# Patient Record
Sex: Female | Born: 1980 | Race: Black or African American | Hispanic: No | Smoking: Light tobacco smoker
Health system: Southern US, Community
[De-identification: ages and names within clinical notes are randomized; demographics above are authoritative.]

## PROBLEM LIST (undated history)

## (undated) ENCOUNTER — Inpatient Hospital Stay (HOSPITAL_COMMUNITY): Payer: Self-pay

## (undated) DIAGNOSIS — E282 Polycystic ovarian syndrome: Secondary | ICD-10-CM

## (undated) DIAGNOSIS — N926 Irregular menstruation, unspecified: Secondary | ICD-10-CM

## (undated) DIAGNOSIS — O24419 Gestational diabetes mellitus in pregnancy, unspecified control: Secondary | ICD-10-CM

## (undated) DIAGNOSIS — Z202 Contact with and (suspected) exposure to infections with a predominantly sexual mode of transmission: Secondary | ICD-10-CM

## (undated) DIAGNOSIS — N883 Incompetence of cervix uteri: Secondary | ICD-10-CM

## (undated) DIAGNOSIS — R87629 Unspecified abnormal cytological findings in specimens from vagina: Secondary | ICD-10-CM

## (undated) DIAGNOSIS — N760 Acute vaginitis: Secondary | ICD-10-CM

## (undated) DIAGNOSIS — B9689 Other specified bacterial agents as the cause of diseases classified elsewhere: Secondary | ICD-10-CM

## (undated) DIAGNOSIS — N200 Calculus of kidney: Secondary | ICD-10-CM

## (undated) DIAGNOSIS — IMO0001 Reserved for inherently not codable concepts without codable children: Secondary | ICD-10-CM

## (undated) DIAGNOSIS — Z62810 Personal history of physical and sexual abuse in childhood: Secondary | ICD-10-CM

## (undated) DIAGNOSIS — O4290 Premature rupture of membranes, unspecified as to length of time between rupture and onset of labor, unspecified weeks of gestation: Secondary | ICD-10-CM

## (undated) DIAGNOSIS — R51 Headache: Secondary | ICD-10-CM

## (undated) DIAGNOSIS — K219 Gastro-esophageal reflux disease without esophagitis: Secondary | ICD-10-CM

## (undated) DIAGNOSIS — R519 Headache, unspecified: Secondary | ICD-10-CM

## (undated) DIAGNOSIS — F419 Anxiety disorder, unspecified: Secondary | ICD-10-CM

## (undated) HISTORY — DX: Headache, unspecified: R51.9

## (undated) HISTORY — DX: Gastro-esophageal reflux disease without esophagitis: K21.9

## (undated) HISTORY — DX: Contact with and (suspected) exposure to infections with a predominantly sexual mode of transmission: Z20.2

## (undated) HISTORY — PX: DILATION AND CURETTAGE OF UTERUS: SHX78

## (undated) HISTORY — DX: Headache: R51

## (undated) HISTORY — DX: Personal history of physical and sexual abuse in childhood: Z62.810

## (undated) HISTORY — DX: Gestational diabetes mellitus in pregnancy, unspecified control: O24.419

## (undated) HISTORY — DX: Incompetence of cervix uteri: N88.3

## (undated) HISTORY — DX: Anxiety disorder, unspecified: F41.9

## (undated) HISTORY — DX: Calculus of kidney: N20.0

---
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
## 2000-02-26 ENCOUNTER — Inpatient Hospital Stay (HOSPITAL_COMMUNITY): Admission: AD | Admit: 2000-02-26 | Discharge: 2000-02-26 | Payer: Self-pay | Admitting: *Deleted

## 2001-08-22 ENCOUNTER — Emergency Department (HOSPITAL_COMMUNITY): Admission: EM | Admit: 2001-08-22 | Discharge: 2001-08-22 | Payer: Self-pay | Admitting: Emergency Medicine

## 2001-09-16 ENCOUNTER — Emergency Department (HOSPITAL_COMMUNITY): Admission: EM | Admit: 2001-09-16 | Discharge: 2001-09-16 | Payer: Self-pay | Admitting: Emergency Medicine

## 2001-11-24 ENCOUNTER — Emergency Department (HOSPITAL_COMMUNITY): Admission: EM | Admit: 2001-11-24 | Discharge: 2001-11-24 | Payer: Self-pay | Admitting: Emergency Medicine

## 2001-11-30 ENCOUNTER — Emergency Department (HOSPITAL_COMMUNITY): Admission: EM | Admit: 2001-11-30 | Discharge: 2001-11-30 | Payer: Self-pay | Admitting: *Deleted

## 2002-02-03 ENCOUNTER — Inpatient Hospital Stay (HOSPITAL_COMMUNITY): Admission: AD | Admit: 2002-02-03 | Discharge: 2002-02-03 | Payer: Self-pay | Admitting: *Deleted

## 2002-05-15 ENCOUNTER — Inpatient Hospital Stay (HOSPITAL_COMMUNITY): Admission: AD | Admit: 2002-05-15 | Discharge: 2002-05-15 | Payer: Self-pay | Admitting: *Deleted

## 2002-10-05 ENCOUNTER — Inpatient Hospital Stay (HOSPITAL_COMMUNITY): Admission: AD | Admit: 2002-10-05 | Discharge: 2002-10-05 | Payer: Self-pay | Admitting: *Deleted

## 2003-03-16 ENCOUNTER — Inpatient Hospital Stay (HOSPITAL_COMMUNITY): Admission: AD | Admit: 2003-03-16 | Discharge: 2003-03-16 | Payer: Self-pay | Admitting: Obstetrics & Gynecology

## 2003-10-14 ENCOUNTER — Inpatient Hospital Stay (HOSPITAL_COMMUNITY): Admission: AD | Admit: 2003-10-14 | Discharge: 2003-10-14 | Payer: Self-pay | Admitting: Obstetrics and Gynecology

## 2007-05-16 HISTORY — PX: ENDOMETRIAL BIOPSY: SHX622

## 2007-06-17 ENCOUNTER — Emergency Department (HOSPITAL_COMMUNITY): Admission: EM | Admit: 2007-06-17 | Discharge: 2007-06-17 | Payer: Self-pay | Admitting: Family Medicine

## 2007-06-18 ENCOUNTER — Inpatient Hospital Stay (HOSPITAL_COMMUNITY): Admission: AD | Admit: 2007-06-18 | Discharge: 2007-06-18 | Payer: Self-pay | Admitting: Obstetrics and Gynecology

## 2007-07-10 ENCOUNTER — Encounter (INDEPENDENT_AMBULATORY_CARE_PROVIDER_SITE_OTHER): Payer: Self-pay | Admitting: Gynecology

## 2007-07-10 ENCOUNTER — Ambulatory Visit: Payer: Self-pay | Admitting: Gynecology

## 2007-07-10 ENCOUNTER — Other Ambulatory Visit: Admission: RE | Admit: 2007-07-10 | Discharge: 2007-07-10 | Payer: Self-pay | Admitting: Obstetrics and Gynecology

## 2007-07-11 ENCOUNTER — Ambulatory Visit: Payer: Self-pay | Admitting: Family Medicine

## 2007-07-12 ENCOUNTER — Ambulatory Visit (HOSPITAL_COMMUNITY): Admission: RE | Admit: 2007-07-12 | Discharge: 2007-07-12 | Payer: Self-pay | Admitting: Family Medicine

## 2007-07-18 ENCOUNTER — Ambulatory Visit: Payer: Self-pay | Admitting: Internal Medicine

## 2007-08-12 ENCOUNTER — Ambulatory Visit: Payer: Self-pay | Admitting: *Deleted

## 2007-09-04 ENCOUNTER — Inpatient Hospital Stay (HOSPITAL_COMMUNITY): Admission: AD | Admit: 2007-09-04 | Discharge: 2007-09-04 | Payer: Self-pay | Admitting: Obstetrics & Gynecology

## 2007-09-05 ENCOUNTER — Ambulatory Visit: Payer: Self-pay | Admitting: Obstetrics and Gynecology

## 2007-12-16 ENCOUNTER — Ambulatory Visit: Payer: Self-pay | Admitting: Internal Medicine

## 2008-01-19 ENCOUNTER — Emergency Department (HOSPITAL_COMMUNITY): Admission: EM | Admit: 2008-01-19 | Discharge: 2008-01-19 | Payer: Self-pay | Admitting: Emergency Medicine

## 2008-03-24 ENCOUNTER — Ambulatory Visit: Payer: Self-pay | Admitting: Internal Medicine

## 2008-03-25 ENCOUNTER — Encounter (INDEPENDENT_AMBULATORY_CARE_PROVIDER_SITE_OTHER): Payer: Self-pay | Admitting: Internal Medicine

## 2008-03-25 LAB — CONVERTED CEMR LAB
Prolactin: 11 ng/mL
TSH: 1.97 microintl units/mL (ref 0.350–4.50)

## 2008-04-22 ENCOUNTER — Ambulatory Visit: Payer: Self-pay | Admitting: Internal Medicine

## 2008-06-11 ENCOUNTER — Inpatient Hospital Stay (HOSPITAL_COMMUNITY): Admission: AD | Admit: 2008-06-11 | Discharge: 2008-06-11 | Payer: Self-pay | Admitting: Obstetrics and Gynecology

## 2010-08-29 LAB — URINALYSIS, ROUTINE W REFLEX MICROSCOPIC
Bilirubin Urine: NEGATIVE
Glucose, UA: NEGATIVE mg/dL
Ketones, ur: NEGATIVE mg/dL
Leukocytes, UA: NEGATIVE
Nitrite: NEGATIVE
Protein, ur: NEGATIVE mg/dL
Specific Gravity, Urine: 1.025 (ref 1.005–1.030)
Urobilinogen, UA: 0.2 mg/dL (ref 0.0–1.0)
pH: 6 (ref 5.0–8.0)

## 2010-08-29 LAB — WET PREP, GENITAL
Trich, Wet Prep: NONE SEEN
Yeast Wet Prep HPF POC: NONE SEEN

## 2010-08-29 LAB — URINE MICROSCOPIC-ADD ON

## 2010-08-29 LAB — GC/CHLAMYDIA PROBE AMP, GENITAL
Chlamydia, DNA Probe: NEGATIVE
GC Probe Amp, Genital: NEGATIVE

## 2010-08-29 LAB — POCT PREGNANCY, URINE: Preg Test, Ur: NEGATIVE

## 2010-09-27 NOTE — Group Therapy Note (Signed)
NAME:  Stacy [name], Stacy [name] NO.:  [id number]   MEDICAL RECORD NO.:  [id number]          PATIENT TYPE:  WOC   LOCATION:  [facility]                   FACILITY:  [facility]   PHYSICIAN:  Ginger Carne, MD DATE OF BIRTH:  [birth year]   DATE OF SERVICE:  07/10/2007                                  CLINIC NOTE   This patient is a 30 year old nulligravida with a longstanding history  of irregular menses since the onset of her periods beginning at age 81.  She has been intermittently on oral contraceptives which apparently she  takes anywhere from 3-6 months and then stops said medications on her  own.  Apparently she has moved around and has never really stayed on a  particular contraceptive for any period of time.  She states usually  when she is on them, she has either regular periods or slight spotting  but still has a regular menses.  She complains of slight hirsutism under  her chin and has had intermittent pelvic pain over the course of  years.  She does not have dyspareunia but does complain of pain after  intercourse.  She has no GI or GU sources for her pain and she has no  musculoskeletal or neurological sources for said pain.  The patient has  no family history of bleeding diatheses.  Takes no medications to  enhance her bleeding propensity.  She has never been hospitalized for  pelvic pain or been given a diagnosis of pelvic inflammatory disease.  She has never been treated for same either.  Remainder of her medical  history is unimpressive.  She is a half pack a day smoker however.   PHYSICAL EXAM:  VITAL SIGNS:  Per office record.  ABDOMEN:  Soft without gross hepatosplenomegaly.  Minimal tenderness in  the lower abdomen.  PELVIC:  Exam, external genitalia, vulva and vagina normal.  Cervix is  smooth without erosions or lesions.  The uterus is normal in size,  minimal tenderness.  Both adnexa palpable, minimal tenderness without  masses.   An endometrial  biopsy was obtained including a Pap smear with GC and  chlamydia culture.   IMPRESSION AND PLAN:  I think the patient may well have polycystic  ovarian syndrome.  However, she could also suffer from chronic  endometritis an endometrial biopsy will be helpful in delineating this  matter.  Her uterus sounded to 7 cm.  I have also ordered a pelvic  sonogram, 17 hydroxy progesterone, prolactin level, TSH, total  testosterone level and a serum pregnancy test as well.  If all turns out  to be normal, hopefully she will stay on her contraceptive medication  and have her menses straighten out.  I encouraged her to continue on the  medication I have prescribed for her, Ortho-Novum 1/35.  If she  continues to have pain with her contraception, it may be necessary to  proceed with a diagnostic laparoscopy in the future to exclude the  either pelvic inflammatory disease or endometriosis.  The patient will  return in 3 to 4 weeks to discuss her findings.  ______________________________  Ginger Carne, MD     SHB/MEDQ  D:  07/10/2007  T:  07/11/2007  Job:  [id number]

## 2010-09-27 NOTE — Group Therapy Note (Signed)
NAME:  Stacy [name], Stacy [name] NO.:  [id number]   MEDICAL RECORD NO.:  [id number]          PATIENT TYPE:  WOC   LOCATION:  [facility]                   FACILITY:  [facility]   PHYSICIAN:  Argentina Donovan, MD        DATE OF BIRTH:  11-09-1980   DATE OF SERVICE:  09/05/2007                                  CLINIC NOTE   The patient present today with complaints of large mass of tissue  expelled from the vagina.  The patient has a history of polycystic  ovarian syndrome and was placed on Desogen.  The patient was seen at [facility]U  last night.  Serum HCG was completed.  It was less than 2.  The patient  reports that the discharge was not sent to the lab.  Today, the patient  just had spotting, no severe bleeding.  She said that it started with  increased pain, then passed the discharge, then there was heavy  bleeding, then today it did resolved to just spotting.  The patient's  last menstrual period began on September 02, 2007.  She also reports white  discharge prior to menstruation starting which had a positive odor.   SURGICAL HISTORY:  None.   SOCIAL HISTORY:  No drug abuse.  Smokes.  Occasionally drinks.   GYNECOLOGIC HISTORY:  No history of abnormal Pap smears.  No history of  sexually transmitted infections.  One partner x4 years.   PHYSICAL EXAMINATION:  PELVIC:  Uterus normal size, normal, nontender  with palpation.  Some bleeding from the os.   ASSESSMENT:  1. Bacterial vaginosis.  2. Menstruation.   PLAN:  Serum HCG quant per the patient's request.  The patient was given  reassurance that it was more than likely a blood clot that was there  that passed with normal menstruation and informed the patient that this  can occur and it is normal.  The patient was also given a prescription  for metronidazole 500 mg one p.o. b.i.d. x7 days for suspected bacterial  vaginosis.  The patient is to follow up for any other problems or  concerns.      Sid Falcon, CNM    ______________________________  Argentina Donovan, MD    WM/MEDQ  D:  09/05/2007  T:  09/05/2007  Job:  [id number]

## 2011-02-03 LAB — POCT URINALYSIS DIP (DEVICE)
Bilirubin Urine: NEGATIVE
Glucose, UA: NEGATIVE
Ketones, ur: NEGATIVE
Nitrite: NEGATIVE
Operator id: 235561
Protein, ur: NEGATIVE
Specific Gravity, Urine: 1.02
Urobilinogen, UA: 0.2
pH: 5.5

## 2011-02-03 LAB — POCT PREGNANCY, URINE
Operator id: 116391
Operator id: 220991
Preg Test, Ur: NEGATIVE
Preg Test, Ur: NEGATIVE

## 2011-02-03 LAB — CBC
HCT: 37.2
HCT: 35.9 — ABNORMAL LOW
Hemoglobin: 12.3
Hemoglobin: 12.2
MCHC: 33
MCHC: 33.9
MCV: 83
MCV: 82.3
Platelets: 269
Platelets: 256
RBC: 4.48
RBC: 4.36
RDW: 15.9 — ABNORMAL HIGH
RDW: 16 — ABNORMAL HIGH
WBC: 5.5
WBC: 5.5

## 2011-02-07 LAB — POCT PREGNANCY, URINE
Operator id: 202651
Preg Test, Ur: NEGATIVE

## 2011-10-22 ENCOUNTER — Encounter (HOSPITAL_COMMUNITY): Payer: Self-pay | Admitting: Obstetrics and Gynecology

## 2011-10-22 ENCOUNTER — Inpatient Hospital Stay (HOSPITAL_COMMUNITY)
Admission: AD | Admit: 2011-10-22 | Discharge: 2011-10-22 | Disposition: A | Discharge status: Home / Self Care | Payer: Self-pay | Source: Ambulatory Visit | Attending: Obstetrics & Gynecology | Admitting: Obstetrics & Gynecology

## 2011-10-22 DIAGNOSIS — N949 Unspecified condition associated with female genital organs and menstrual cycle: Secondary | ICD-10-CM | POA: Insufficient documentation

## 2011-10-22 DIAGNOSIS — N760 Acute vaginitis: Secondary | ICD-10-CM | POA: Insufficient documentation

## 2011-10-22 DIAGNOSIS — B9689 Other specified bacterial agents as the cause of diseases classified elsewhere: Secondary | ICD-10-CM | POA: Insufficient documentation

## 2011-10-22 DIAGNOSIS — A499 Bacterial infection, unspecified: Secondary | ICD-10-CM

## 2011-10-22 HISTORY — DX: Irregular menstruation, unspecified: N92.6

## 2011-10-22 HISTORY — DX: Polycystic ovarian syndrome: E28.2

## 2011-10-22 LAB — WET PREP, GENITAL
Trich, Wet Prep: NONE SEEN
Yeast Wet Prep HPF POC: NONE SEEN

## 2011-10-22 LAB — URINALYSIS, ROUTINE W REFLEX MICROSCOPIC
Bilirubin Urine: NEGATIVE
Glucose, UA: NEGATIVE mg/dL
Ketones, ur: NEGATIVE mg/dL
Leukocytes, UA: NEGATIVE
Nitrite: NEGATIVE
Protein, ur: NEGATIVE mg/dL
Specific Gravity, Urine: 1.015 (ref 1.005–1.030)
Urobilinogen, UA: 0.2 mg/dL (ref 0.0–1.0)
pH: 7 (ref 5.0–8.0)

## 2011-10-22 LAB — URINE MICROSCOPIC-ADD ON

## 2011-10-22 LAB — POCT PREGNANCY, URINE: Preg Test, Ur: NEGATIVE

## 2011-10-22 MED ORDER — METRONIDAZOLE 0.75 % VA GEL: 1.0000 | Freq: Two times a day (BID) | VAGINAL | Status: AC

## 2011-10-22 MED ORDER — METRONIDAZOLE 500 MG PO TABS: 500.0000 mg | ORAL_TABLET | Freq: Two times a day (BID) | ORAL | Status: AC

## 2011-10-22 MED ORDER — DOXYCYCLINE HYCLATE 100 MG PO CAPS: 100.0000 mg | ORAL_CAPSULE | Freq: Two times a day (BID) | ORAL | Status: AC

## 2011-10-22 NOTE — [facility] Provider Note (Signed)
History     CSN: [id number]  Arrival date & time 10/22/11  1438   None     Chief Complaint  Patient presents with  . Vaginal Discharge    Recurrent BV x 18 months per pt    (Consider location/radiation/quality/duration/timing/severity/associated sxs/prior treatment) HPI Stacy [name] is a 31 y.o. G0P0 who c/o vaginal odor like when has BV. Pt has had BV chronically since 1/13, has used Flagyl, boric acid supp and probiotics. Most recent tx was Flagyl 2 po after IC, still gets symptoms after. Same partner x 8 yr. Also c/o pulling sensation in low LM, has had off/on for a few years, getting progressively worse and more frequent. Recently moved back to [place], starts a new job tomorrow, no provider yet.   Past Medical History  Diagnosis Date  . PCOS (polycystic ovarian syndrome)   . Irregular menses     Past Surgical History  Procedure Date  . Endometrial biopsy 2009    Family History  Problem Relation Age of Onset  . Mental illness Maternal Aunt   . Diabetes Maternal Uncle     History  Substance Use Topics  . Smoking status: Current Everyday Smoker -- 0.5 packs/day for 15 years    Types: Cigarettes  . Smokeless tobacco: Not on file  . Alcohol Use: Yes     social drinker    OB History    Grav Para Term Preterm Abortions TAB SAB Ect Mult Living   0               Review of Systems  Constitutional: Negative for fever and chills.  Gastrointestinal: Negative.   Genitourinary: Positive for vaginal discharge and pelvic pain. Negative for dysuria, urgency, frequency and vaginal bleeding.    Allergies  Review of patient's allergies indicates no known allergies.  Home Medications  No current outpatient prescriptions on file.  BP 132/91  Pulse 70  Temp(Src) 99.2 F (37.3 C) (Oral)  Resp 16  Ht 5' 4.33" (1.634 m)  Wt 153 lb 3.2 oz (69.491 kg)  BMI 26.03 kg/m2  LMP 10/08/2011  Physical Exam  Constitutional: She is oriented to person, place, and time. She  appears well-developed and well-nourished.  Abdominal: Soft. There is tenderness.       Low ML  Genitourinary:       Pelvic: V/V nl anatomy, skin intact Cx- nullip Uterus- A-V, nl size , sl tender Adn- no masses palp, non tender  Musculoskeletal: Normal range of motion.  Neurological: She is alert and oriented to person, place, and time.  Skin: Skin is warm and dry.    ED Course  Procedures (including critical care time)  Labs Reviewed  URINALYSIS, ROUTINE W REFLEX MICROSCOPIC - Abnormal; Notable for the following:    Hgb urine dipstick MODERATE (*)    All other components within normal limits  URINE MICROSCOPIC-ADD ON - Abnormal; Notable for the following:    Squamous Epithelial / LPF FEW (*)    All other components within normal limits  POCT PREGNANCY, URINE   No results found.   No diagnosis found.    MDM  Persistent Bacterial vaginosis  uterine tenderness Rx for Metrogel  Rx for Flagyl if can't afford Metrogel  Doxycycline 100 mg BID x 7 Pt to get established with provider

## 2011-10-22 NOTE — MAU Note (Signed)
"  I have dealt with this vaginal discharge for the past 18 months.  I notice that the smell is worse after intercourse.  I have been with the same partner for 8 years, so I know it isn't from messing around with different people.  I have been having this pulling sensation in my uterus for the past 6 months.  The pulling sensation comes without warning and is scary to me.  I have been treated numerous times for the BV, but the last time my symptoms have not gone away since January 2013."

## 2011-10-22 NOTE — Discharge Instructions (Signed)
Bacterial Vaginosis Bacterial vaginosis (BV) is a vaginal infection where the normal balance of bacteria in the vagina is disrupted. The normal balance is then replaced by an overgrowth of certain bacteria. There are several different kinds of bacteria that can cause BV. BV is the most common vaginal infection in women of childbearing age. CAUSES   The cause of BV is not fully understood. BV develops when there is an increase or imbalance of harmful bacteria.   Some activities or behaviors can upset the normal balance of bacteria in the vagina and put women at increased risk including:   Having a new sex partner or multiple sex partners.   Douching.   Using an intrauterine device (IUD) for contraception.   It is not clear what role sexual activity plays in the development of BV. However, women that have never had sexual intercourse are rarely infected with BV.  Women do not get BV from toilet seats, bedding, swimming pools or from touching objects around them.  SYMPTOMS   Grey vaginal discharge.   A fish-like odor with discharge, especially after sexual intercourse.   Itching or burning of the vagina and vulva.   Burning or pain with urination.   Some women have no signs or symptoms at all.  DIAGNOSIS  Your caregiver must examine the vagina for signs of BV. Your caregiver will perform lab tests and look at the sample of vaginal fluid through a microscope. They will look for bacteria and abnormal cells (clue cells), a pH test higher than 4.5, and a positive amine test all associated with BV.  RISKS AND COMPLICATIONS   Pelvic inflammatory disease (PID).   Infections following gynecology surgery.   Developing HIV.   Developing herpes virus.  TREATMENT  Sometimes BV will clear up without treatment. However, all women with symptoms of BV should be treated to avoid complications, especially if gynecology surgery is planned. Female partners generally do not need to be treated. However,  BV may spread between female sex partners so treatment is helpful in preventing a recurrence of BV.   BV may be treated with antibiotics. The antibiotics come in either pill or vaginal cream forms. Either can be used with nonpregnant or pregnant women, but the recommended dosages differ. These antibiotics are not harmful to the baby.   BV can recur after treatment. If this happens, a second round of antibiotics will often be prescribed.   Treatment is important for pregnant women. If not treated, BV can cause a premature delivery, especially for a pregnant woman who had a premature birth in the past. All pregnant women who have symptoms of BV should be checked and treated.   For chronic reoccurrence of BV, treatment with a type of prescribed gel vaginally twice a week is helpful.  HOME CARE INSTRUCTIONS   Finish all medication as directed by your caregiver.   Do not have sex until treatment is completed.   Tell your sexual partner that you have a vaginal infection. They should see their caregiver and be treated if they have problems, such as a mild rash or itching.   Practice safe sex. Use condoms. Only have 1 sex partner.  PREVENTION  Basic prevention steps can help reduce the risk of upsetting the natural balance of bacteria in the vagina and developing BV:  Do not have sexual intercourse (be abstinent).   Do not douche.   Use all of the medicine prescribed for treatment of BV, even if the signs and symptoms go away.     Tell your sex partner if you have BV. That way, they can be treated, if needed, to prevent reoccurrence.  SEEK MEDICAL CARE IF:   Your symptoms are not improving after 3 days of treatment.   You have increased discharge, pain, or fever.  MAKE SURE YOU:   Understand these instructions.   Will watch your condition.   Will get help right away if you are not doing well or get worse.  FOR MORE INFORMATION  Division of STD Prevention (DSTDP), Centers for Disease  Control and Prevention: www.cdc.gov/std American Social Health Association (ASHA): www.ashastd.org  Document Released: 05/01/2005 Document Revised: 04/20/2011 Document Reviewed: 10/22/2008 ExitCare Patient Information 2012 [employer]. 

## 2012-11-01 ENCOUNTER — Ambulatory Visit: Payer: Self-pay

## 2012-11-26 LAB — OB RESULTS CONSOLE RUBELLA ANTIBODY, IGM: Rubella: IMMUNE

## 2012-11-26 LAB — OB RESULTS CONSOLE ANTIBODY SCREEN: Antibody Screen: NEGATIVE

## 2012-11-26 LAB — OB RESULTS CONSOLE ABO/RH: RH Type: POSITIVE

## 2012-11-26 LAB — OB RESULTS CONSOLE HEPATITIS B SURFACE ANTIGEN: Hepatitis B Surface Ag: NEGATIVE

## 2012-11-26 LAB — OB RESULTS CONSOLE HIV ANTIBODY (ROUTINE TESTING): HIV: NONREACTIVE

## 2013-01-08 ENCOUNTER — Inpatient Hospital Stay (HOSPITAL_COMMUNITY)
Admission: AD | Admit: 2013-01-08 | Discharge: 2013-01-08 | Disposition: A | Discharge status: Home / Self Care | Payer: Medicaid Other | Source: Ambulatory Visit | Attending: Obstetrics and Gynecology | Admitting: Obstetrics and Gynecology

## 2013-01-08 ENCOUNTER — Encounter (HOSPITAL_COMMUNITY): Payer: Self-pay | Admitting: *Deleted

## 2013-01-08 ENCOUNTER — Inpatient Hospital Stay (HOSPITAL_COMMUNITY): Payer: Medicaid Other

## 2013-01-08 DIAGNOSIS — O99891 Other specified diseases and conditions complicating pregnancy: Secondary | ICD-10-CM | POA: Insufficient documentation

## 2013-01-08 DIAGNOSIS — IMO0001 Reserved for inherently not codable concepts without codable children: Secondary | ICD-10-CM | POA: Diagnosis present

## 2013-01-08 DIAGNOSIS — R109 Unspecified abdominal pain: Secondary | ICD-10-CM | POA: Insufficient documentation

## 2013-01-08 DIAGNOSIS — Z8742 Personal history of other diseases of the female genital tract: Secondary | ICD-10-CM

## 2013-01-08 DIAGNOSIS — M2550 Pain in unspecified joint: Secondary | ICD-10-CM | POA: Insufficient documentation

## 2013-01-08 DIAGNOSIS — N926 Irregular menstruation, unspecified: Secondary | ICD-10-CM

## 2013-01-08 DIAGNOSIS — O30009 Twin pregnancy, unspecified number of placenta and unspecified number of amniotic sacs, unspecified trimester: Secondary | ICD-10-CM | POA: Insufficient documentation

## 2013-01-08 HISTORY — DX: Reserved for inherently not codable concepts without codable children: IMO0001

## 2013-01-08 HISTORY — DX: Other specified bacterial agents as the cause of diseases classified elsewhere: B96.89

## 2013-01-08 HISTORY — DX: Other specified bacterial agents as the cause of diseases classified elsewhere: N76.0

## 2013-01-08 LAB — URINE MICROSCOPIC-ADD ON

## 2013-01-08 LAB — URINALYSIS, ROUTINE W REFLEX MICROSCOPIC
Bilirubin Urine: NEGATIVE
Glucose, UA: NEGATIVE mg/dL
Specific Gravity, Urine: 1.02 (ref 1.005–1.030)
Urobilinogen, UA: 0.2 mg/dL (ref 0.0–1.0)

## 2013-01-08 MED ORDER — IBUPROFEN 600 MG PO TABS: 600.0000 mg | ORAL_TABLET | Freq: Four times a day (QID) | ORAL | Status: DC

## 2013-01-08 MED ORDER — IBUPROFEN 600 MG PO TABS
600.0000 mg | ORAL_TABLET | Freq: Four times a day (QID) | ORAL | Status: DC
  Administered 2013-01-08: 600 mg via ORAL
  Filled 2013-01-08: qty 1

## 2013-01-08 NOTE — MAU Provider Note (Signed)
History   32 yo G2P0010 at 14 weeks with twins presented after calling the office c/o cramping, pain in joints on right side and sporadic numbness around the joints.  "Just was worried with all the various things happening"--concerned regarding well-being of twins.  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  Pregnancy was unexpected, as was twins--has been very stressed with this pregnancy.  Chief Complaint  Patient presents with  . Joint Pain  . Abdominal Pain     OB History   Grav Para Term Preterm Abortions TAB SAB Ect Mult Living   2 0   1  1   0      Past Medical History  Diagnosis Date  . PCOS (polycystic ovarian syndrome)   . Irregular menses   . PCOS (polycystic ovarian syndrome)   . BV (bacterial vaginosis)     Past Surgical History  Procedure Laterality Date  . Endometrial biopsy  2009    Family History  Problem Relation Age of Onset  . Mental illness Maternal Aunt   . Diabetes Maternal Uncle     History  Substance Use Topics  . Smoking status: Light Tobacco Smoker -- 0.50 packs/day for 15 years    Types: Cigarettes  . Smokeless tobacco: Not on file  . Alcohol Use: Yes     Comment: social drinker not while pregnant    Allergies: No Known Allergies  Prescriptions prior to admission  Medication Sig Dispense Refill  . metroNIDAZOLE (FLAGYL) 500 MG tablet Take 500 mg by mouth 3 (three) times daily.      . Prenatal Vit-Fe Fumarate-FA (PRENATAL MULTIVITAMIN) TABS tablet Take 1 tablet by mouth daily at 12 noon.         Physical Exam   Chest clear Heart RRR without murmur Abd gravid, NT Uterus 17 week size, NT Pelvic--cervix closed, NT Extremities without swelling, full ROM noted x 4 extremities.  Sensation intact to pinprick.  FHRs 140s x 2   ED Course  Twin IUP  Mild cramping  Mild joint pain Stressors with pregnancy  Plan: UA ANA Limited US for cervical length Ibuprophen 600 mg po now--plan q 6 hours x 24 hours, then prn. Reassurance and support  regarding status of pregnancy.   Nigel Bridgeman CNM, MN 01/08/2013 2:44 PM  Addendum:  Feeling "about the same" after Ibuprophen.  No acute pain at present.  US WNL, with normal cervical length. UA:  SG 1.020, 15 ketones, moderate blood.  Sent to culture.  D/C'd home. Continue Ibuprophen 600 mg po q 6 hours x 24 hours, then prn. Push po fluids, increase rest as able. OOW tomorrow to allow for rest. Reassured patient regarding stable status of pregnancy.  Support to her regarding work issues. F/U as scheduled or prn.  Nigel Bridgeman, CNM 01/08/13 3p

## 2013-01-08 NOTE — MAU Note (Addendum)
Pt reports having transient joint pain/numbness on her right side (knee, hip, shoulder and elbow.) that started 1 week ago. Pt also c/o of abd/pelvic pain that started today. Pt pregnant with twins.

## 2013-01-10 LAB — CULTURE, OB URINE: Colony Count: 50000

## 2013-03-04 ENCOUNTER — Inpatient Hospital Stay (HOSPITAL_COMMUNITY)
Admission: AD | Admit: 2013-03-04 | Discharge: 2013-03-19 | DRG: 765 | Disposition: A | Discharge status: Home / Self Care | Payer: Medicaid Other | Source: Ambulatory Visit | Attending: Obstetrics and Gynecology | Admitting: Obstetrics and Gynecology

## 2013-03-04 ENCOUNTER — Encounter (HOSPITAL_COMMUNITY): Payer: Self-pay | Admitting: *Deleted

## 2013-03-04 ENCOUNTER — Ambulatory Visit (HOSPITAL_COMMUNITY): Payer: Medicaid Other

## 2013-03-04 ENCOUNTER — Inpatient Hospital Stay (HOSPITAL_COMMUNITY): Payer: Medicaid Other

## 2013-03-04 DIAGNOSIS — O3090 Multiple gestation, unspecified, unspecified trimester: Secondary | ICD-10-CM | POA: Diagnosis present

## 2013-03-04 DIAGNOSIS — O9902 Anemia complicating childbirth: Secondary | ICD-10-CM | POA: Diagnosis present

## 2013-03-04 DIAGNOSIS — O2390 Unspecified genitourinary tract infection in pregnancy, unspecified trimester: Secondary | ICD-10-CM | POA: Diagnosis present

## 2013-03-04 DIAGNOSIS — D649 Anemia, unspecified: Secondary | ICD-10-CM | POA: Diagnosis present

## 2013-03-04 DIAGNOSIS — O3432 Maternal care for cervical incompetence, second trimester: Secondary | ICD-10-CM | POA: Diagnosis not present

## 2013-03-04 DIAGNOSIS — O4290 Premature rupture of membranes, unspecified as to length of time between rupture and onset of labor, unspecified weeks of gestation: Secondary | ICD-10-CM | POA: Diagnosis not present

## 2013-03-04 DIAGNOSIS — B9689 Other specified bacterial agents as the cause of diseases classified elsewhere: Secondary | ICD-10-CM | POA: Diagnosis present

## 2013-03-04 DIAGNOSIS — O30009 Twin pregnancy, unspecified number of placenta and unspecified number of amniotic sacs, unspecified trimester: Secondary | ICD-10-CM | POA: Diagnosis present

## 2013-03-04 DIAGNOSIS — O3430 Maternal care for cervical incompetence, unspecified trimester: Principal | ICD-10-CM | POA: Diagnosis present

## 2013-03-04 DIAGNOSIS — O26879 Cervical shortening, unspecified trimester: Secondary | ICD-10-CM | POA: Diagnosis present

## 2013-03-04 DIAGNOSIS — O99334 Smoking (tobacco) complicating childbirth: Secondary | ICD-10-CM | POA: Diagnosis present

## 2013-03-04 DIAGNOSIS — IMO0001 Reserved for inherently not codable concepts without codable children: Secondary | ICD-10-CM

## 2013-03-04 DIAGNOSIS — O30049 Twin pregnancy, dichorionic/diamniotic, unspecified trimester: Secondary | ICD-10-CM

## 2013-03-04 DIAGNOSIS — N760 Acute vaginitis: Secondary | ICD-10-CM | POA: Diagnosis present

## 2013-03-04 DIAGNOSIS — A499 Bacterial infection, unspecified: Secondary | ICD-10-CM | POA: Diagnosis present

## 2013-03-04 DIAGNOSIS — E282 Polycystic ovarian syndrome: Secondary | ICD-10-CM | POA: Diagnosis present

## 2013-03-04 DIAGNOSIS — O3431 Maternal care for cervical incompetence, first trimester: Secondary | ICD-10-CM

## 2013-03-04 DIAGNOSIS — O411090 Infection of amniotic sac and membranes, unspecified, unspecified trimester, not applicable or unspecified: Secondary | ICD-10-CM | POA: Diagnosis present

## 2013-03-04 DIAGNOSIS — O34599 Maternal care for other abnormalities of gravid uterus, unspecified trimester: Secondary | ICD-10-CM | POA: Diagnosis present

## 2013-03-04 LAB — CBC
HCT: 28.9 % — ABNORMAL LOW (ref 36.0–46.0)
MCHC: 34.9 g/dL (ref 30.0–36.0)
Platelets: 193 10*3/uL (ref 150–400)
RDW: 14.7 % (ref 11.5–15.5)
WBC: 8.3 10*3/uL (ref 4.0–10.5)

## 2013-03-04 LAB — CREATININE, SERUM
Creatinine, Ser: 0.41 mg/dL — ABNORMAL LOW (ref 0.50–1.10)
GFR calc Af Amer: >90 mL/min (ref >90)
GFR calc non Af Amer: >90 mL/min (ref >90)

## 2013-03-04 MED ORDER — ENOXAPARIN SODIUM 40 MG/0.4ML ~~LOC~~ SOLN
40.0000 mg | SUBCUTANEOUS | Status: DC
  Filled 2013-03-04 (×13): qty 0.4

## 2013-03-04 MED ORDER — ZOLPIDEM TARTRATE 5 MG PO TABS
5.0000 mg | ORAL_TABLET | Freq: Every evening | ORAL | Status: DC | PRN
  Administered 2013-03-05 – 2013-03-14 (×9): 5 mg via ORAL
  Filled 2013-03-04 (×10): qty 1

## 2013-03-04 MED ORDER — INDOMETHACIN 25 MG PO CAPS
25.0000 mg | ORAL_CAPSULE | Freq: Four times a day (QID) | ORAL | Status: DC
  Administered 2013-03-05 – 2013-03-11 (×27): 25 mg via ORAL
  Filled 2013-03-04 (×31): qty 1

## 2013-03-04 MED ORDER — CALCIUM CARBONATE ANTACID 500 MG PO CHEW
2.0000 | CHEWABLE_TABLET | ORAL | Status: DC | PRN
  Administered 2013-03-06 (×2): 400 mg via ORAL
  Filled 2013-03-04 (×3): qty 1

## 2013-03-04 MED ORDER — INDOMETHACIN 25 MG PO CAPS
50.0000 mg | ORAL_CAPSULE | Freq: Once | ORAL | Status: AC
  Administered 2013-03-04: 50 mg via ORAL
  Filled 2013-03-04: qty 1

## 2013-03-04 MED ORDER — PROGESTERONE MICRONIZED 200 MG PO CAPS
200.0000 mg | ORAL_CAPSULE | Freq: Every day | ORAL | Status: DC
  Administered 2013-03-04 – 2013-03-10 (×8): 200 mg via VAGINAL
  Filled 2013-03-04 (×9): qty 1

## 2013-03-04 MED ORDER — DOCUSATE SODIUM 100 MG PO CAPS
100.0000 mg | ORAL_CAPSULE | Freq: Every day | ORAL | Status: DC
  Administered 2013-03-05 – 2013-03-15 (×11): 100 mg via ORAL
  Filled 2013-03-04 (×10): qty 1

## 2013-03-04 MED ORDER — PRENATAL MULTIVITAMIN CH
1.0000 | ORAL_TABLET | Freq: Every day | ORAL | Status: DC
  Administered 2013-03-05 – 2013-03-15 (×11): 1 via ORAL
  Filled 2013-03-04 (×11): qty 1

## 2013-03-04 MED ORDER — ACETAMINOPHEN 325 MG PO TABS
650.0000 mg | ORAL_TABLET | ORAL | Status: DC | PRN
  Administered 2013-03-12 – 2013-03-16 (×2): 650 mg via ORAL
  Filled 2013-03-04 (×2): qty 2

## 2013-03-04 MED ORDER — AMOXICILLIN 500 MG PO CAPS
500.0000 mg | ORAL_CAPSULE | Freq: Three times a day (TID) | ORAL | Status: AC
  Administered 2013-03-04 – 2013-03-07 (×9): 500 mg via ORAL
  Filled 2013-03-04 (×9): qty 1

## 2013-03-04 NOTE — H&P (Signed)
Admission History and Physical Exam for an Obstetrics Patient  Ms. Kemonie L Feldmeier is a 32 y.o. female, G2P0010, at [redacted]w[redacted]d gestation, who presents for observation and evaluation of cervical insufficiency. The patient has a diamniotic and dichorionic twin gestation. She was seen one week ago an ultrasound showed that the cervix had thinned to 1.6 cm. She was started on vaginal progesterone. An ultrasound was performed today that showed no measurable cervix. The cervix was slightly dilated. The patient denies rupture membranes and vaginal bleeding. She has had some vague abdominal pain. Gonorrhea and Chlamydia cultures were negative are normal the exam. Urine culture was negative.. She has been followed at the [facility].  Her pregnancy has been complicated by cigarette smoking, bacterial vaginosis, and a history of polycystic ovary disease. See history below.  OB History   Grav Para Term Preterm Abortions TAB SAB Ect Mult Living   2 0   1  1   0      Past Medical History  Diagnosis Date  . PCOS (polycystic ovarian syndrome)   . Irregular menses   . PCOS (polycystic ovarian syndrome)   . BV (bacterial vaginosis)     Prescriptions prior to admission  Medication Sig Dispense Refill  . ibuprofen (ADVIL,MOTRIN) 600 MG tablet Take 1 tablet (600 mg total) by mouth every 6 (six) hours.  30 tablet  0  . Prenatal Vit-Fe Fumarate-FA (PRENATAL MULTIVITAMIN) TABS tablet Take 1 tablet by mouth daily at 12 noon.      . progesterone (PROMETRIUM) 100 MG capsule Take 100 mg by mouth daily.        Past Surgical History  Procedure Laterality Date  . Endometrial biopsy  2009  . Dilation and curettage of uterus      No Known Allergies  Family History: family history includes Diabetes in her maternal uncle; Mental illness in her maternal aunt.  Social History:  reports that she has been smoking Cigarettes.  She has a 7.5 pack-year  smoking history. She does not have any smokeless tobacco history on file. She reports that she drinks alcohol. She reports that she uses illicit drugs (Marijuana).  Review of systems: Normal pregnancy complaints.  Admission Physical Exam:    Body mass index is 27.45 kg/(m^2).  Blood pressure 109/62, pulse 83, temperature 98.3 F (36.8 C), temperature source Oral, resp. rate 20, height 5\' 4"  (1.626 m), weight 160 lb (72.576 kg).  HEENT:                 Within normal limits Chest:                   Clear Heart:                    Regular rate and rhythm Abdomen:             Gravid and nontender Extremities:          Grossly normal Neurologic exam: Grossly normal Pelvic exam:         Cervix: Slightly open and completely effaced by ultrasound report.  Prenatal labs: ABO, Rh:             B/Positive/-- (07/15 0000) HBsAg:                 Negative (07/15 0000)  HIV:  Non-reactive (07/15 0000)  GBS:                      Pending Antibody:              Negative (07/15 0000) Rubella:                  immune  RPR:                    Nonreactive (07/15 0000)      Assessment:  [redacted]w[redacted]d gestation  Diamniotic and dichorionic twin gestation;  Vertex/ breech presentation  Cervical insufficiency  Cigarette smoker  Nonviable gestation  Anemia  Polycystic ovary syndrome  Bacterial vaginosis  Plan:  Please see recommendations a maternal fetal medicine specialist. A cerclage in the current clinical situation is not recommended. A pessary will be investigated.  Prometrium 200 mg vaginally each day.  Urine culture and beta strep cultures are pending.  Amoxicillin 500 mg 3 times each day until the beta strep culture is negative. I do not know that we can justify continuing antibiotics beyond that point.  I will begin indomethacin. There may be a small benefit. Given that her infants are nonviable at this time, I believe the potential benefit does outweigh the  potential risks. The patient will need to have weekly ultrasounds to assess amniotic fluid volumes.  I've requested a neonatology consult.  The patient understands that her infants are nonviable at this point. We will not intervene for fetal indications. She understands that at [redacted] weeks gestation a decision needs to be made whether not to intervene if there is evidence of fetal intolerance of pregnancy. She understands that there is a small chance of survival at that point. At [redacted] weeks gestation she understands that we do recommend intervention for fetal indications.   We will begin DVT prophylaxis.   Janine Limbo 03/04/2013, 6:33 PM

## 2013-03-04 NOTE — Consult Note (Signed)
Maternal Fetal Medicine Consultation  Requesting Provider(s): Kirkland Hun, MD  Reason for consultation: Twin gestation at 21 6/7 weeks, incompetent cervix  HPI: Stacy [name] is a 32 yo G3P0020  currently at 47 6/[redacted] weeks gestation with twin gestation - was seen earlier in clinic today and noted to have a cervical length < 1 cm with fetal membranes visible at the external os.  She was noted to have some cervical shortening last week and was started on vaginal progesterone.  She reports that over the last 2 weeks, she has noted some "round ligament pain", but denies uterine cramping, contractions, vaginal bleeding, heavy discharge or leakage of fluid.  The patient denies any known risk factors for incompetent cervix.  Her prenatal course has otherwise been uncomplicated.  OB History: OB History   Grav Para Term Preterm Abortions TAB SAB Ect Mult Living   2 0   1  1   0      PMH:  Past Medical History  Diagnosis Date  . PCOS (polycystic ovarian syndrome)   . Irregular menses   . PCOS (polycystic ovarian syndrome)   . BV (bacterial vaginosis)     PSH:  Past Surgical History  Procedure Laterality Date  . Endometrial biopsy  2009  . Dilation and curettage of uterus     Meds: PNV, Prometrium  Allergies: No Known Allergies  FH: denies family history of birth defects or hereditary disorders  Soc: smokes 5-6 cigarettes/ day, denies ETOH use during pregnancy  Review of Systems: no vaginal bleeding or cramping/contractions, no LOF, no nausea/vomiting. All other systems reviewed and are negative.  PE:   Filed Vitals:   03/04/13 1402  BP: 109/62  Pulse: 83  Temp: 98.3 F (36.8 C)  Resp: 20    GEN: well-appearing female ABD: gravid, NT  Ultrasound: DC/DA twin gestation with best dates of 21 6/7 weeks Limited ultrasound performed for cervical length  TVUS - cervix is open with V-shaped funneling almost to the external os (~ 2-3 mm cervical length).  Some cervical  debris is noted.  Labs: CBC    Component Value Date/Time   WBC 5.5 06/18/2007 1005   RBC 4.36 06/18/2007 1005   HGB 12.2 06/18/2007 1005   HCT 35.9* 06/18/2007 1005   PLT 256 06/18/2007 1005   MCV 82.3 06/18/2007 1005   MCHC 33.9 06/18/2007 1005   RDW 16.0* 06/18/2007 1005     A/P: 1) DC/DA twins at 21 6/7 weeks         2) Suspected cervical insufficiency - ultrasound findings were reviewed with the patient.  We discussed possible management strategies.  Based on the most recent ACOG Practice Bulletin on cervical insufficiency, ultrasound indicated cerclage is not recommended in Twin gestation and some literature would suggest that cerclage in this setting may actually be associated with worse outcomes.  Concur with vaginal progesterone - although there may not be any significant benefit in multiple gestations, it is a low risk intervention.  We also discussed the possible use of cervical pessary.  Although some preliminary data did not show benefit in multiple gestations, further trials are needed to determine whether on not this may be of some benefit. In my opinion, it is also a very low risk intervention that may have some benefit.  I reviewed this option with the patient and she expressed some interest.  I will need to find out if we have any cervical pessaries available tomorrow.  If available, will evaluate the patient  further and possibly place a pessary tomorrow evening.   Thank you for the opportunity to be a part of the care of Stacy [name]. Please contact our office if we can be of further assistance.   I spent approximately 30 minutes with this patient with over 50% of time spent in face-to-face counseling.  Alpha Gula, MD Maternal Fetal Medicine

## 2013-03-05 ENCOUNTER — Encounter (HOSPITAL_COMMUNITY): Payer: Self-pay | Admitting: *Deleted

## 2013-03-05 LAB — URINE CULTURE: Colony Count: 40000

## 2013-03-05 LAB — TYPE AND SCREEN
ABO/RH(D): B POS
Antibody Screen: NEGATIVE

## 2013-03-05 LAB — AMNISURE RUPTURE OF MEMBRANE (ROM) NOT AT ARMC: Amnisure ROM: NEGATIVE

## 2013-03-05 NOTE — Progress Notes (Signed)
Patient called out to nurse's station asking to see her nurse.  RN comes to bedside and patient states, "I feel like I'm leaking, my vagina is wet."  RN inspected vaginal area, no fluid noted on bed pad or perineum.  Fern test was negative, J Oxley CNM notified of patient's concern and fern result.  CNM ordered Asa Lente collected by [employer].  Amnisure result was negative and J Oxley CNM was notified of result.

## 2013-03-05 NOTE — Progress Notes (Signed)
  Subjective: Pt called RN to BS to report increased vaginal d/c worried she ruptured.   Objective: BP 112/45  Pulse 76  Temp(Src) 98.3 F (36.8 C) (Oral)  Resp 18  Ht 5\' 4"  (1.626 m)  Wt 160 lb (72.576 kg)  BMI 27.45 kg/m2     Fern inconclusive Amnisure neg   FHT:  Last doppler 135 UC:   None SVE:    Deferred  Assessment / Plan:  Labor: No labor at this time; no ROM at this time Preeclampsia: no s/s Fetal Wellbeing: Appropriate for GA Pain Control: None needed at this time I/D: GBS unknown; Amoxicillin 500mg  TID until GBS is neg    Erricka Falkner 03/05/2013, 6:53 AM

## 2013-03-05 NOTE — Progress Notes (Signed)
03/05/13 1400  Clinical Encounter Type  Visited With Patient  Visit Type Initial;Spiritual support;Social support  Referral From Nurse  Recommendations Spiritual Care will follow for support.  Spiritual Encounters  Spiritual Needs Emotional;Prayer  Stress Factors  Patient Stress Factors Family relationships;Financial concerns;Lack of caregivers;Loss of control;Major life changes   Stacy [name] was very receptive to pastoral presence and welcomed opportunity to share and process her story.  She reports very little support.  Both of her parents are deceased, and her boyfriend lives in [place].  While she appears to be a strong supporter of her siblings and friends, she reports that those relationships are less reciprocal than she would like.  Per pt, her coworkers and supervisors are very supportive, but they all live in [place] area.  Bylonappears to be both realistic and hopeful about the current status of her pregnancy.  We prayed for her and for her babies by name.    Provided introduction to Spiritual Care and chaplain availability, reflective listening, encouragement and affirmation, and discussion of coping tools.  Will follow for emotional and spiritual support.  [address] [place], [place] [phone]

## 2013-03-05 NOTE — Progress Notes (Signed)
Came by to see patient--doing well, resting comfortably.  Denies SOB, chest pain. Reviewed plan of care, anticipating pessary placement tomorrow. Will await further information regarding pessary utilization/expectations/guidelines from MFM.  Support to patient for concerns.  Nigel Bridgeman, CNM 03/05/13 8:30p

## 2013-03-05 NOTE — Progress Notes (Signed)
Notified Dr. Normand Sloop of pt. C/o of chest pain and fast heart rate. I listened to her lung sounds and heart tones. All lung sounds are clear. All heart tones are normal. I placed O2 sat on pt. And the SO2 was 98% and HR was 77. Will continue to monitor.

## 2013-03-05 NOTE — Consult Note (Signed)
Asked by Dr Stefano Gaul to speak to Stacy [name] to discuss outcome of preterm delivery. Stacy [name] is [redacted] weeks pregnant today, twin gestation, with cervical insufficiency. GBS is pending. She is on Amox, ibuprofen, and progesterone.  I spoke to Stacy [name] in her room with her partner present.  I discussed survival data for 22-24 wk preterm newborns. She is very aware that currently, this is a nonviable gestation. I discussed the extremely poor survival at 23 weeks  and the profound neurodevelopmental disabilities associated with those survivors. I discussed that [redacted] wks gestation is the earliest gestation with more reasonable outcome, although these babies face high morbidity and high mortality. I discussed improving outcomes with increasing gestation. I also discussed the variable of increased complications with twin gestation.  Stacy [name] said she will aim for 30 wks. I applaud and support her positive attitude.   Thank you for this consult.  I spent  30 minutes with this patient, over 50% of time spent in face-to-face counseling.  Lucillie Garfinkel, MD Neonatologist

## 2013-03-05 NOTE — Progress Notes (Signed)
Patient ID: Stacy [name], female   DOB: June 04, 1980, 32 y.o.   MRN: [id number] Pt without complaints.  No leakage of fluid or VB.  Good FM.  Pt would like a bedside commode for a BM  BP 117/63  Pulse 88  Temp(Src) 98.5 F (36.9 C) (Oral)  Resp 18  Ht 5\' 4"  (1.626 m)  Wt 160 lb (72.576 kg)  BMI 27.45 kg/m2  FHTS Baseline: A 140, B 140 bpm  Toco none  Pt in NAD CV RRR Lungs CTAB abd  Gravid soft and NT GU no vb EXt no calf tenderness Results for orders placed during the hospital encounter of 03/04/13 (from the past 72 hour(s))  CBC     Status: Abnormal   Collection Time    03/04/13  7:04 PM      Result Value Range   WBC 8.3  4.0 - 10.5 K/uL   RBC 3.14 (*) 3.87 - 5.11 MIL/uL   Hemoglobin 10.1 (*) 12.0 - 15.0 g/dL   HCT 40.9 (*) 81.1 - 91.4 %   MCV 92.0  78.0 - 100.0 fL   MCH 32.2  26.0 - 34.0 pg   MCHC 34.9  30.0 - 36.0 g/dL   RDW 78.2  95.6 - 21.3 %   Platelets 193  150 - 400 K/uL  CREATININE, SERUM     Status: Abnormal   Collection Time    03/04/13  7:04 PM      Result Value Range   Creatinine, Ser 0.41 (*) 0.50 - 1.10 mg/dL   GFR calc non Af Amer >90  >90 mL/min   GFR calc Af Amer >90  >90 mL/min   Comment: (NOTE)     The eGFR has been calculated using the CKD EPI equation.     This calculation has not been validated in all clinical situations.     eGFR's persistently <90 mL/min signify possible Chronic Kidney     Disease.  AMNISURE RUPTURE OF MEMBRANE (ROM)     Status: None   Collection Time    03/05/13  6:03 AM      Result Value Range   Amnisure ROM NEGATIVE    TYPE AND SCREEN     Status: None   Collection Time    03/05/13  9:30 AM      Result Value Range   ABO/RH(D) B POS     Antibody Screen NEG     Sample Expiration 03/08/2013      Assessment and Plan [redacted]w[redacted]d  Incompetent cervix Pt stable  May have bedside commode for BM. Should not use for more than 15 minutes GBS pending in [place]

## 2013-03-06 ENCOUNTER — Inpatient Hospital Stay (HOSPITAL_COMMUNITY): Payer: Medicaid Other

## 2013-03-06 DIAGNOSIS — O3432 Maternal care for cervical incompetence, second trimester: Secondary | ICD-10-CM | POA: Diagnosis not present

## 2013-03-06 DIAGNOSIS — D649 Anemia, unspecified: Secondary | ICD-10-CM | POA: Diagnosis present

## 2013-03-06 MED ORDER — PANTOPRAZOLE SODIUM 40 MG PO TBEC
40.0000 mg | DELAYED_RELEASE_TABLET | Freq: Every day | ORAL | Status: DC
  Administered 2013-03-06 – 2013-03-15 (×10): 40 mg via ORAL
  Filled 2013-03-06 (×12): qty 1

## 2013-03-06 NOTE — Progress Notes (Signed)
I had a brief visit with Marcine because she had a visitor.  She was grateful for chaplain support and I will follow up with her tomorrow.  Please page as needs arise, [phone].  Agnes Lawrence Shelitha Magley 10:26 AM   03/06/13 1000  Clinical Encounter Type  Visited With Patient  Visit Type Follow-up  Referral From Chaplain

## 2013-03-06 NOTE — Progress Notes (Signed)
Patient ID: Stacy [name], female   DOB: 01/01/1981, 32 y.o.   MRN: [id number] JASKIRAT ZERTUCHE is a 32 y.o. G3P0020 at [redacted]w[redacted]d by ultrasound admitted for cervical insufficiency in twin pregnancy  Subjective: GI: Denies nausea, vomiting or diarrhea.  Admits to heartburn sx which have responded to omeprazole in the past GU: Denies: dysuria, frequency/urgency, genital discharge, vaginal bleeding, pelvic pain OB: good fetal movement.  Denies any discomfort from pessary placed by Dr. Claudean Severance this am.  Is able to void and has had a small bm since then         Objective: BP 121/68  Pulse 82  Temp(Src) 98.6 F (37 C) (Oral)  Resp 18  Ht 5\' 4"  (1.626 m)  Wt 160 lb (72.576 kg)  BMI 27.45 kg/m2      Labs: Lab Results  Component Value Date   WBC 8.3 03/04/2013   HGB 10.1* 03/04/2013   HCT 28.9* 03/04/2013   MCV 92.0 03/04/2013   PLT 193 03/04/2013    Assessment / Plan: Cervical insufficiency Twin gestation Anemia GERD  Pessary and bedrest with other interventions per Dr. Fredda Hammed note Feso4 Omerazole per pt request. Will D/c antibiotic when neg GBS culture returns     Latecia Miler P 03/06/2013, 5:24 PM

## 2013-03-06 NOTE — Progress Notes (Signed)
Stacy [name]  was seen today for an ultrasound appointment.  See full report in [facility].  Comments: [name] returns for follow up.  Earlier this week, she was admitted due to shortened cervix and U-shaped funneling.  She was not deemed a candidate for rescue cerclage due to twin gestation.  She is currently on vaginal progesterone.  After counseling, the patient elected to try cervical pessary - she understands that the benefit, particularly in multiple gestation [place] uncertain although some liturature supports its use in shortened cervix in singleton gestation.  Sterile speculum exam was performed.  The cervicx appears to be 1-2 cm dilated with membranes visible at the external os.  A cup pessary was placed without diffuculty.  Impression: DC/DA twin gestation with best dates of 22 1/7 weeks Suspected incompetent cervix TVUS - membranes to external os with cervical debris noted Cup pessary placed without difficulty - on exam, the membranes appear to be somewhat less funneled following pessary placement.  Recommendations: Recommend that the pessary remain in place - would only remove for PROM, vaginal bleeding or labor. The pessary does not need to be routinely removed for cleaning - able to perform transvaginal ultrasound with the pessary in place. Would offer course of betamethasone at 22 6/7 weeks  NICU consult once viability reached for further counseling No known benefit for latency antibiotics, but would initiate if PROM were to occur Recommend follow up ultrasound for cervical length in 1 week.  Alpha Gula, MD

## 2013-03-07 DIAGNOSIS — O4290 Premature rupture of membranes, unspecified as to length of time between rupture and onset of labor, unspecified weeks of gestation: Secondary | ICD-10-CM | POA: Diagnosis not present

## 2013-03-07 HISTORY — DX: Premature rupture of membranes, unspecified as to length of time between rupture and onset of labor, unspecified weeks of gestation: O42.90

## 2013-03-07 LAB — OB RESULTS CONSOLE GBS: GBS: NEGATIVE

## 2013-03-07 LAB — AMNISURE RUPTURE OF MEMBRANE (ROM) NOT AT ARMC: Amnisure ROM: POSITIVE

## 2013-03-07 MED ORDER — AZITHROMYCIN 1 G PO PACK
1.0000 g | PACK | Freq: Once | ORAL | Status: AC
  Administered 2013-03-07: 1 g via ORAL
  Filled 2013-03-07: qty 1

## 2013-03-07 MED ORDER — LACTATED RINGERS IV SOLN
INTRAVENOUS | Status: DC
  Administered 2013-03-07 – 2013-03-13 (×8): via INTRAVENOUS

## 2013-03-07 MED ORDER — AMPICILLIN SODIUM 2 G IJ SOLR
2.0000 g | Freq: Four times a day (QID) | INTRAMUSCULAR | Status: AC
  Administered 2013-03-07 – 2013-03-09 (×8): 2 g via INTRAVENOUS
  Filled 2013-03-07 (×8): qty 2000

## 2013-03-07 MED ORDER — AMOXICILLIN 500 MG PO CAPS
500.0000 mg | ORAL_CAPSULE | Freq: Three times a day (TID) | ORAL | Status: AC
  Administered 2013-03-09 – 2013-03-14 (×15): 500 mg via ORAL
  Filled 2013-03-07 (×15): qty 1

## 2013-03-07 NOTE — Progress Notes (Signed)
Pt asking to speak NICU.  Neo called and coming to talk to pt about the [plan

## 2013-03-07 NOTE — Progress Notes (Addendum)
Patient ID: Stacy [name], female DOB: 02/10/1981, 32 y.o. MRN: [id number]   Stacy [name] is a 32 y.o. G3P0020 at [redacted]w[redacted]d by ultrasound admitted for cervical insufficiency in twin pregnancy  Subjective: Pt was laying in bed watching tv and napping.  Pt voiced concern about how she would accomplishing voting while on bedrest.  Encouraged her to explore absentee voting.  Pt also asked if I would f/u on her FMLA papers at the office.  Objective: BP 118/58  Pulse 82  Temp(Src) 98.4 F (36.9 C) (Oral)  Resp 18  Ht 5\' 4"  (1.626 m)  Wt 160 lb (72.576 kg)  BMI 27.45 kg/m2      Assessment / Plan: Cervical insufficiency  Twin gestation  Anemia  GERD  GBS neg on 10/21 culture  Pessary and bedrest D/c antibiotic    Genieve Ramaswamy 03/07/2013, 2:36 PM  Dr. Fredda Hammed Recommendations on 10/23:  Recommend that the pessary remain in place - would only remove for PROM, vaginal bleeding or labor.  The pessary does not need to be routinely removed for cleaning - able to perform transvaginal ultrasound with the pessary in place.  Would offer course of betamethasone at 22 6/7 weeks  NICU consult once viability reached for further counseling  No known benefit for latency antibiotics, but would initiate if PROM were to occur  Recommend follow up ultrasound for cervical length in 1 week.

## 2013-03-07 NOTE — Progress Notes (Signed)
Amnisure ordered

## 2013-03-07 NOTE — Consult Note (Signed)
Neonatology Consult to Antenatal Patient: Follow-up  I was asked by Dr. Stefano Gaul to see this patient in order to provide antenatal counseling due to SROM today.  Stacy [name] was admitted on 10/21 for incompetent cervix and is now at 43 2/[redacted] weeks GA with twin gestation. EDC is felt to be very reliable per 14 week ultrasound. She is not having contractions. Dr. Mikle Bosworth spoke with her 2 days ago, but SROM occurred today and Stacy [name] had some additional questions given the change of circumstances.  I spoke with the patient alone. I explained that lung development proceeds quite consistently and that, at 22-23 weeks, the structural lack of alveoli makes resuscitative efforts futile. Even at 23 weeks, there are far fewer alveoli present than will be later in gestation such that the mortality rate remains exceedingly high. Among survivors at 23-24 weeks, the rate of severe neurodevelopmental sequelae is also very high. Given these facts, we do not attempt resuscitation before 23 weeks.   Thank you for asking me to see this delightful patient. If she remains pregnant at 46 0/7 weeks or beyond, we will be very happy to assist in any way possible to care for her babies.  Stacy Sou, MD Neonatologist  The total length of face-to-face or floor/unit time for this encounter was 35 minutes. Counseling and/or coordination of care was 30 minutes of the above.

## 2013-03-07 NOTE — Progress Notes (Addendum)
32 y.o. year old female,at [redacted]w[redacted]d gestation.  SUBJECTIVE:  The patient reports a leakage of fluid. Amnisure test confirms ruptured membranes. The patient is very sad.  OBJECTIVE:  BP 110/61  Pulse 82  Temp(Src) 98.5 F (36.9 C) (Oral)  Resp 18  Ht 5\' 4"  (1.626 m)  Wt 160 lb (72.576 kg)  BMI 27.45 kg/m2  Contractions:          None  Abdomen: Nontender  Pelvic exam: Pessary removed. The cervix is 1-2 cm.  Beta strep: Negative   ASSESSMENT:  [redacted]w[redacted]d Weeks Pregnancy  Preterm and premature rupture membranes  PLAN:  I reviewed the patient's findings with the patient. We will begin prophylactic antibiotics for rupture membranes per protocol.  She understands that there is nothing more that we can do to help her twin infant survived. For now we will continue in-hospital management.  Leonard Schwartz, M.D.

## 2013-03-07 NOTE — Progress Notes (Signed)
Stacy [name] was in good spirits today.  She had a visit from a good friend yesterday. She is taking things one moment at a time.  We talked about how to communicate her needs and desire for support from family and friends.  She has a Electrical engineer and is also very appreciative of all the support she is receiving from staff here at the hospital.  We will continue to follow for support, but please also page as needs arise.  [employer] Pager, [phone] 10:30 AM   03/07/13 1000  Clinical Encounter Type  Visited With Patient  Visit Type Follow-up;Spiritual support

## 2013-03-07 NOTE — Progress Notes (Signed)
RN called to room with pt screaming and crying that he water just broke.  Pt stating that she heard a pop and "my water broke".

## 2013-03-08 NOTE — Progress Notes (Signed)
Hospital day # 4 pregnancy at [redacted]w[redacted]d--Twin pregnancy, cervical insufficiency, PROM 03/07/13.  S:  Sleeping soundly--partner at bedside.       RN reports patient requested [place] for assessment of fluid s/p SROM yesterday and evaluation of cervical status.  O: BP 120/64  Pulse 80  Temp(Src) 98.3 F (36.8 C) (Oral)  Resp 18  Ht 5\' 4"  (1.626 m)  Wt 160 lb (72.576 kg)  BMI 27.45 kg/m2      Fetal tracings:  FHRs 145 at 6:35am      Contractions:   None per toco or patient report      SCDs on         Labs:  GBS negative        Meds: ATB regimen for PROM  A: [redacted]w[redacted]d with PPROM, twin gestation, cervical insufficiency.     Stable  P: Continue current plan of care      Will consult with Dr. Stefano Gaul regarding patient's request for [place].      Support to patient for issues.      MDs will follow  Nigel Bridgeman CNM, [place] 03/08/2013 9:15 AM

## 2013-03-08 NOTE — Progress Notes (Signed)
32 y.o. year old female,at [redacted]w[redacted]d gestation.  SUBJECTIVE:  Doing OK. No contractions.  OBJECTIVE:  BP 120/64  Pulse 80  Temp(Src) 98.3 F (36.8 C) (Oral)  Resp 18  Ht 5\' 4"  (1.626 m)  Wt 160 lb (72.576 kg)  BMI 27.45 kg/m2  Abd: nontender  ASSESSMENT:  [redacted]w[redacted]d Weeks Pregnancy  PROM  Twins  PLAN:  Continue observation. Will obtain an [place] at 22w 6d because a CS will be done at 23 weeks.  Leonard Schwartz, M.D.

## 2013-03-08 NOTE — Progress Notes (Signed)
Met with mother who was pleasant and receptive to social work intervention.  Mother states that she is [redacted] weeks pregnant with twins and on complete bed rest.  She and FOB Bishop Limbo have been in a relationship for the past 9 years.  She was employed during pregnancy and is concerned about the loss of income.  She has short term and long term disability, and states that she recently learned that she could apply for [facility] and foodstamps.  Patient is very worried about the babies.   Allowed her to talk about her concerns and fears.  Provided supportive feedback.  She was appreciative of the visit with CSW.   Will continue to follow for support.   Mccade Sullenberger J, LCSW

## 2013-03-09 NOTE — Progress Notes (Signed)
Patient ID: Stacy [name], female   DOB: Feb 22, 1981, 32 y.o.   MRN: [id number]  Hospital day # 5 pregnancy at [redacted]w[redacted]d, twins, PPROM at [redacted]w[redacted]d on 10/24   S: well, reports good fetal activity      Contractions:none      Vaginal bleeding:none now       Vaginal discharge: no significant change  O: BP 106/48  Pulse 100  Temp(Src) 98.7 F (37.1 C) (Oral)  Resp 20  Ht 5\' 4"  (1.626 m)  Wt 160 lb (72.576 kg)  BMI 27.45 kg/m2      Fetal tracings: FHR doppler x2       Uterus non-tender      Extremities: no significant edema and no signs of DVT  A: [redacted]w[redacted]d with PPROM      stable  P: continue current plan of care Will do [place] at [redacted]w[redacted]d, on Tuesday the 28th Will start BMZ course on Tuesday, as well.  Will discuss w pt her plan at 23wks-24wks, (will need to decide if she wants full resuscitation)  Dr Stefano Gaul in to see pt   Malissa Hippo  CNM  03/09/2013 11:48 AM

## 2013-03-09 NOTE — Progress Notes (Signed)
Dr Stefano Gaul aware of pt forgetting to place progesterone. OK to place last night's dose now. Pt notified she can place. Ordering lunch now.

## 2013-03-09 NOTE — Progress Notes (Signed)
PROGRESS NOTE  I have reviewed the patient's vital signs, labs, and notes. I have seen the patient. I agree with the previous note from the Certified Nurse Midwife.  Leonard Schwartz, M.D. 03/09/2013

## 2013-03-09 NOTE — Progress Notes (Signed)
In to help pt place progersterone, unable to locate since pt's room has been cleaned. New dose ordered and given.

## 2013-03-09 NOTE — Progress Notes (Addendum)
Pt just found her progesterone tablet in her room and states she just realized she forgot she did not insert insert it last night. She does not know what to do. Will notify provider. [address],cnm called and coming to round and evaluate medications.

## 2013-03-10 ENCOUNTER — Inpatient Hospital Stay (HOSPITAL_COMMUNITY): Payer: Medicaid Other

## 2013-03-10 NOTE — Progress Notes (Signed)
Ur chart review completed.  

## 2013-03-10 NOTE — Consult Note (Signed)
MFM Note  Dichorionic/diamniotic twin pregnancy at 22+5 weeks admitted on 10/21 for significant cervical changes. TVUS revealed U-shaped funneling with membranes to the external os which appeared to be 1-2 cms dilated. The fetuses were in cephalic/breech positions. Ms. Welling was seen by Dr. Claudean Severance and she opted for pessary placement which occurred on 10/23. She was placed on Indocin and vaginal progesterone (continued). The following afternoon her membranes ruptured but has had no s/s of intrauterine infection or labor. PPROM antibiotics were initiated.   Ms. Beauchesne has had two neonatal consultations. The outcomes of neonates at a range of GAs were reviewed. In particular, the 23-24 week outcomes were discussed in detail.  Today, we talked for about 25 minutes. Her main concern was that there were no "holes" in her care. We reviewed her management and she was reassured that her care was appropriate and there are no other therapies or interventions for very early twins with PPROM that we were aware. She made it very clear that she wants "everything done" starting at 23+0 weeks which includes an urgent C/S if necessary. At the end of our conversation, she had no further questions.  A/P  1) Di/Di twin pregnancy at 22+5 weeks 2) PPROM x 3 days 3) Desires full resuscitation of twins at 23+0 weeks  - [place] is planned for tomorrow at 7 AM which would include EFWs and presentations - First BMZ to be given tomorrow (at 22+6 weeks) and the second on 10/29 (at 23+0 weeks) - Starting Wednesday 10/29, deliver by C/S for usual indications (infection, labor, abruption, etc) - Can discontinue vaginal progesterone and indocin - Starting at 23+0 weeks, attempt to monitor twice daily; if concerning, BPP  If specific questions have not been addressed, please give [place] a call.  (Face-to-face consultation with patient: 25 min) -

## 2013-03-10 NOTE — Progress Notes (Signed)
Patient ID: Smitty Pluck, female   DOB: 1980/10/15, 32 y.o.   MRN: [id number] Stacy [name] is a 32 y.o. G3P0020 at [redacted]w[redacted]d by ultrasound admitted for cervical insufficiency, and preterm premature rupture of membranes twin A.  Subjective: GI: Denies nausea, vomiting or diarrhea. GU: Denies: dysuria, frequency/urgency, vaginal bleeding, pelvic pain OB: Good fetal movement        Objective: BP 106/58  Pulse 108  Temp(Src) 98.3 F (36.8 C) (Oral)  Resp 18  Ht 5\' 4"  (1.626 m)  Wt 160 lb (72.576 kg)  BMI 27.45 kg/m2      FHT: 142/144 UC:   none SVE:    deferred  Labs: Lab Results  Component Value Date   WBC 8.3 03/04/2013   HGB 10.1* 03/04/2013   HCT 28.9* 03/04/2013   MCV 92.0 03/04/2013   PLT 193 03/04/2013    Assessment / Plan: Cervical insufficiency Twin gestation at 22 weeks and 5 days Preterm premature rupture of membranes and twin A No evidence of labor Patient's desire for cesarean section should labor ensue Plan repeat maternal fetal medicine consultation today in the presence of ruptured membranes to review timing and options for delivery as well as appropriate preparations at varying gestational ages   [facility] P 03/10/2013, 3:27 PM

## 2013-03-11 ENCOUNTER — Inpatient Hospital Stay (HOSPITAL_COMMUNITY): Payer: Medicaid Other

## 2013-03-11 ENCOUNTER — Encounter (HOSPITAL_COMMUNITY): Payer: Medicaid Other

## 2013-03-11 LAB — CREATININE, SERUM
GFR calc Af Amer: >90 mL/min (ref >90)
GFR calc non Af Amer: >90 mL/min (ref >90)

## 2013-03-11 LAB — TYPE AND SCREEN: ABO/RH(D): B POS

## 2013-03-12 MED ORDER — CYCLOBENZAPRINE HCL 5 MG PO TABS
5.0000 mg | ORAL_TABLET | Freq: Three times a day (TID) | ORAL | Status: DC | PRN
  Administered 2013-03-12: 5 mg via ORAL
  Filled 2013-03-12 (×2): qty 1

## 2013-03-12 MED ORDER — BETAMETHASONE SOD PHOS & ACET 6 (3-3) MG/ML IJ SUSP
12.0000 mg | Freq: Every day | INTRAMUSCULAR | Status: AC
  Administered 2013-03-12 – 2013-03-13 (×2): 12 mg via INTRAMUSCULAR
  Filled 2013-03-12 (×2): qty 2

## 2013-03-12 NOTE — Progress Notes (Signed)
Pt off the monitor after reassurring FHR  

## 2013-03-12 NOTE — Progress Notes (Addendum)
Patient ID: Smitty Pluck, female   DOB: April 19, 1981, 32 y.o.   MRN: [id number] Stacy [name] is a 32 y.o. G3P0020 at [redacted]w[redacted]d admitted for no cervical length and subsequent rupture  Subjective: Pt is visibly upset and reports anxiety about the pregnancy.  She is upset about not being seen earlier in the day and was extremely anxious about the results (and not knowing them) of her ultrasound.  Dr. Sherrie George had called her earlier in the day per the RN and pt and reviewed the ultrasound by phone at length with the patient.  Objective: BP 117/58  Pulse 96  Temp(Src) 98.3 F (36.8 C) (Oral)  Resp 18  Ht 5\' 4"  (1.626 m)  Wt 72.576 kg (160 lb)  BMI 27.45 kg/m2      Physical Exam:  Gen: alert Chest/Lungs: cta bilaterally  Heart/Pulse: RRR  Uterine fundus: soft, nontender Skin & Color: warm and dry  Neurological: DTRs wnl EXT: negative Homan's b/l, edema neg  FHT:  x2 140s UC:   none SVE:    deferred  Labs: Lab Results  Component Value Date   WBC 8.3 03/04/2013   HGB 10.1* 03/04/2013   HCT 28.9* 03/04/2013   MCV 92.0 03/04/2013   PLT 193 03/04/2013   U/s weights 1.5lbs for each twin, twin A with low fluid and twin B with nl fluid.  Vtx/vtx  Assessment and Plan: Twins at 19 6/7 wks.  Lengthy discussion with the patient and 2 RNs regarding ultrasound and plan of care.  Pt wants c/s (everything done) if delivery is indicated.  Delivery indication includes maternal or fetal compromise as pt made it clear to MFM, Dr. Sherrie George, that she wants everything done.  Pt will get first dose of BMZ today and second dose tomorrow as per Dr. Eber Jones consult note and indomethacin and vaginal progesterone will be d/c'd.  Pt currently has no s/sxs of chorio.  Will cont to observe and start twice daily monitoring and BPP if indicated as per Dr. Sherrie George.  PPROM antibiotic protocol to be completed on Thurs.   Stacy [name] Y 03/12/2013, 6:04 AM

## 2013-03-12 NOTE — Progress Notes (Signed)
Patient ID: Stacy [name], female   DOB: 1981-05-03, 32 y.o.   MRN: [id number] Pt with complaint of feeling muscle soreness from laying in the bed.   leakage of fluid, no  VB.  Good FM  BP 110/62  Pulse 93  Temp(Src) 98.6 F (37 C) (Oral)  Resp 18  Ht 5\' 4"  (1.626 m)  Wt 171 lb 1.6 oz (77.61 kg)  BMI 29.35 kg/m2  FHTS Baseline: 145 times two bpm  Toco none  Pt in NAD CV RRR Lungs CTAB abd  Gravid soft and NT GU no vb EXt no calf tenderness Results for orders placed during the hospital encounter of 03/04/13 (from the past 72 hour(s))  CREATININE, SERUM     Status: Abnormal   Collection Time    03/11/13  5:40 AM      Result Value Range   Creatinine, Ser 0.39 (*) 0.50 - 1.10 mg/dL   GFR calc non Af Amer >90  >90 mL/min   GFR calc Af Amer >90  >90 mL/min   Comment: (NOTE)     The eGFR has been calculated using the CKD EPI equation.     This calculation has not been validated in all clinical situations.     eGFR's persistently <90 mL/min signify possible Chronic Kidney     Disease.  TYPE AND SCREEN     Status: None   Collection Time    03/11/13  5:40 AM      Result Value Range   ABO/RH(D) B POS     Antibody Screen NEG     Sample Expiration 03/14/2013      Assessment and Plan [redacted]w[redacted]d  Di/Di twins PTL PPROM baby A Start betamethasone today NST twice daily Flexeril and supportive care for muscle soreness Pt aware of and agrees with the plan of care

## 2013-03-12 NOTE — Progress Notes (Signed)
Antenatal Nutrition Assessment:  Currently  [redacted] weeks gestation, with cervical insufficiency, PROM, twins. Height  64" Weight 171 lbs pre-pregnancy weight 150 lbs.Pre-pregnancy  BMI 25.8  IBW 120 lbs  Total weight gain 21 lbs. Weight gain goals 31-50 lbs.   Estimated needs: 22-2400 kcal/day, 70-80 grams protein/day, 2.2 liters fluid/day  Antenatal regular diet tolerated well, appetite good. Current diet prescription will provide for increased needs.  nutrition related labs  Hemoglobin & Hematocrit     Component Value Date/Time   HGB 10.1* 03/04/2013 1904   HCT 28.9* 03/04/2013 1904   On PNV  Nutrition Dx: Increased nutrient needs r/t pregnancy and fetal growth requirements aeb 23 weeks twin gestation.  No educational needs assessed at this time.  Elisabeth Cara M.Odis Luster LDN Neonatal Nutrition Support Specialist Pager [phone]

## 2013-03-12 NOTE — Progress Notes (Addendum)
MD aware of pt c/o slight odor to her vaginal area although her vaginal discharge has not changed per pt.Pt is afebrile and abd nontender

## 2013-03-13 LAB — WET PREP, GENITAL
Trich, Wet Prep: NONE SEEN
Yeast Wet Prep HPF POC: NONE SEEN

## 2013-03-13 NOTE — Progress Notes (Signed)
Wet prep obtained and sent to lab.

## 2013-03-13 NOTE — Progress Notes (Signed)
32 y.o. year old female,at [redacted]w[redacted]d gestation.  SUBJECTIVE:  Doing well today. No bleeding or cramping. She is pleased that her babies are now [redacted] weeks gestation.  OBJECTIVE:  BP 114/58  Pulse 80  Temp(Src) 98.5 F (36.9 C) (Oral)  Resp 18  Ht 5\' 4"  (1.626 m)  Wt 171 lb 1.6 oz (77.61 kg)  BMI 29.35 kg/m2  Fetal Heart Tones:  Stable x 2  Contractions:          None  Chest: Clear Heart: Regular rate and rhythm Abdomen: Soft and nontender Extremities: No masses, nontender  ASSESSMENT:  [redacted]w[redacted]d Weeks Pregnancy  Preterm and premature rupture membranes  PLAN:  The patient will receive her second dose of betamethasone today.  The patient and  I reviewed and confirmed her management options and wishes. Her twins are now vertex and vertex. The patient does want [place] to perform a cesarean section in the event of fetal intolerance of labor. For now, both babies look good and we feel there is no need to intervene at this time.  Leonard Schwartz, M.D.

## 2013-03-13 NOTE — Progress Notes (Signed)
Stacy [name] was in good spirits today and was grateful for good news.  She was busy updating her family on how things have been going.  We will continue to follow her for support, but please also page as needs arise.  [employer] Pager, [phone] 10:12 AM   03/13/13 1000  Clinical Encounter Type  Visited With Patient  Visit Type Spiritual support;Follow-up

## 2013-03-13 NOTE — Progress Notes (Signed)
Pt off the monitor after reassurring FHR  

## 2013-03-14 NOTE — Progress Notes (Signed)
Pt is oob and has stripped linen from bed.  Going to shower.  Reminded pt of bedrest order.  Pt states that it will not hurt her....the patient proceeding to shower.

## 2013-03-14 NOTE — Progress Notes (Signed)
Patient ID: Stacy [name], female   DOB: 28-Sep-1980, 32 y.o.   MRN: [id number]  Hospital day # 10 pregnancy at [redacted]w[redacted]d for PPROM   S: well, reports good fetal activity x2      Contractions:none      Vaginal bleeding:none now       Vaginal discharge: mucousy, green and no significant change Pt continues to refuse lovenox SQ (has never taken it)  Per RN pt ambulates about room, took shower this AM)   O: BP 111/64  Pulse 89  Temp(Src) 98.4 F (36.9 C) (Oral)  Resp 18  Ht 5\' 4"  (1.626 m)  Wt 171 lb 1.6 oz (77.61 kg)  BMI 29.35 kg/m2      Fetal tracings:reviewed and reassuring      Uterus non-tender      Extremities: no significant edema and no signs of DVT  A: [redacted]w[redacted]d with PPROM     stable rcv'd BMZ 10/28, 10/29   P: continue current plan of care Will DC IV   Jennfier Abdulla M  CNM  03/14/2013 10:25 AM

## 2013-03-15 ENCOUNTER — Inpatient Hospital Stay (HOSPITAL_COMMUNITY): Payer: Medicaid Other

## 2013-03-15 LAB — CBC WITH DIFFERENTIAL/PLATELET
Basophils Absolute: 0 10*3/uL (ref 0.0–0.1)
Basophils Relative: 0 % (ref 0–1)
Eosinophils Relative: 0 % (ref 0–5)
HCT: 24.8 % — ABNORMAL LOW (ref 36.0–46.0)
Lymphocytes Relative: 15 % (ref 12–46)
MCH: 32.1 pg (ref 26.0–34.0)
MCHC: 34.7 g/dL (ref 30.0–36.0)
MCV: 92.5 fL (ref 78.0–100.0)
Neutro Abs: 8.8 10*3/uL — ABNORMAL HIGH (ref 1.7–7.7)
Neutrophils Relative %: 69 % (ref 43–77)
Platelets: 214 10*3/uL (ref 150–400)
RDW: 14.1 % (ref 11.5–15.5)
WBC: 12.7 10*3/uL — ABNORMAL HIGH (ref 4.0–10.5)

## 2013-03-15 MED ORDER — LACTATED RINGERS IV BOLUS (SEPSIS)
500.0000 mL | Freq: Once | INTRAVENOUS | Status: AC
  Administered 2013-03-15 – 2013-03-16 (×2): 500 mL via INTRAVENOUS

## 2013-03-15 MED ORDER — LACTATED RINGERS IV SOLN
INTRAVENOUS | Status: DC
  Administered 2013-03-15 – 2013-03-16 (×3): via INTRAVENOUS

## 2013-03-15 NOTE — Progress Notes (Signed)
Called by Ante RN to request removal of cardio. FHRs reassuring. Only 1 UC in last hour.  No increase in leaking. VSS.  OK to d/c continuous cardios due to minimal uterine activity.  Will CTO.  Nigel Bridgeman, CNM 03/15/13 3p

## 2013-03-15 NOTE — Progress Notes (Signed)
Hospital day # 11 pregnancy at [redacted]w[redacted]d--Twin pregnancy, with PPROM 03/07/13. .  S:  Doing well, reports good fetal activity      Perception of contractions: None      Vaginal bleeding: None       Vaginal discharge:  mucousy and no significant change--wet prep negative 03/13/13.  Reports has traffic ticket in [place] that needs to be attended to--has lawyer, but has issues with needing to send money to [place] and have a letter notarized.  "May have to go to [employer] don't trust anyone else".  Issues reviewed--advised patient we would not be able to allow her to leave the hospital due to risks of compromise of babies, prolapsed cord, etc.  Informed patient there notary services are available in house (house coverage and HIM office).  Recommended she explore her resources to complete any outside tasks that are necessary. Support offered to patient for lack of trustworthy friends (may be willing to have boyfriend help with these activities).  O: BP 113/58  Pulse 83  Temp(Src) 98.2 F (36.8 C) (Oral)  Resp 17  Ht 5\' 4"  (1.626 m)  Wt 171 lb 1.6 oz (77.61 kg)  BMI 29.35 kg/m2      Fetal tracings:  Fetus A 150, Fetus B 140.      Contractions:   None per patient or monitor      Uterus gravid and non-tender      Extremities: extremities normal, atraumatic, no cyanosis or edema and no significant edema and no signs of DVT          Labs:  T&S q 72 hours.       Meds: Declining Lovexnox                 Completed Amoxicillin po course yesterday  A: [redacted]w[redacted]d with PPROM     Stable     Social issues  P: Continue current plan of care      Upcoming tests/treatments:  T&S q 72 hours      MDs will follow      Will ask Social Work to see patient for any available assistance regarding issues.  Nigel Bridgeman CNM, [place] 03/15/2013 9:56 AM

## 2013-03-15 NOTE — Progress Notes (Addendum)
Called to see patient due to increased leaking and greenish color to d/c.  Reports cramping 5-6x/hour within the last 2 hours, now 7/10, "but I have a high pain tolerance".  Filed Vitals:   03/14/13 2024 03/15/13 0900 03/15/13 0939 03/15/13 1157  BP: 113/58  122/66 116/70  Pulse: 83  77 79  Temp: 98 F (36.7 C) 98.2 F (36.8 C)  98.8 F (37.1 C)  TempSrc: Oral Oral  Oral  Resp: 18 17  18   Height:      Weight:       FHRs reassuring on monitoring around 11am. UCs currently q 7-8 min, mild to palpation.  Leaking moderate fluid with greenish tinge. Uterus NT.  Consulted with Dr. Su Hilt. STAT bedside US for position and fluid of twins, cervical assessment. CBC w/diff. Restart IV for hydration. Per Dr. Cory Roughen breech, I will perform VE.  If vtx, will defer VE.  Nigel Bridgeman, CNM 03/15/13 1:50p  Pt says she is less uncomfortable now.  U/s continues to show vtx/vtx presentation.  Continues to leak fluid slightly greenish.  Abdomen is still soft and NT.  Will cont to observe and as per lengthy conversation yest, pt likely will allow baby A to deliver vaginally if goes into labor and then observe to see whether she continues in labor with baby B which she still understands may need a c/s.  However, it may allow more time for baby B to be undelivered longer.

## 2013-03-16 ENCOUNTER — Encounter (HOSPITAL_COMMUNITY)
Admission: AD | Disposition: A | Discharge status: Home / Self Care | Payer: Self-pay | Source: Ambulatory Visit | Attending: Obstetrics and Gynecology

## 2013-03-16 ENCOUNTER — Inpatient Hospital Stay (HOSPITAL_COMMUNITY): Payer: Medicaid Other

## 2013-03-16 ENCOUNTER — Encounter (HOSPITAL_COMMUNITY): Payer: Self-pay | Admitting: Anesthesiology

## 2013-03-16 ENCOUNTER — Encounter (HOSPITAL_COMMUNITY): Payer: Medicaid Other | Admitting: Anesthesiology

## 2013-03-16 ENCOUNTER — Inpatient Hospital Stay (HOSPITAL_COMMUNITY): Payer: Medicaid Other | Admitting: Anesthesiology

## 2013-03-16 LAB — CBC
HCT: 28 % — ABNORMAL LOW (ref 36.0–46.0)
Hemoglobin: 9.8 g/dL — ABNORMAL LOW (ref 12.0–15.0)
RBC: 3.02 MIL/uL — ABNORMAL LOW (ref 3.87–5.11)
WBC: 11.1 10*3/uL — ABNORMAL HIGH (ref 4.0–10.5)

## 2013-03-16 SURGERY — Surgical Case
Anesthesia: Spinal | Site: Abdomen | Wound class: Clean

## 2013-03-16 MED ORDER — NALOXONE HCL 1 MG/ML IJ SOLN
1.0000 ug/kg/h | INTRAVENOUS | Status: DC | PRN
  Filled 2013-03-16: qty 2

## 2013-03-16 MED ORDER — WITCH HAZEL-GLYCERIN EX PADS: 1.0000 "application " | MEDICATED_PAD | CUTANEOUS | Status: DC | PRN

## 2013-03-16 MED ORDER — KETOROLAC TROMETHAMINE 30 MG/ML IJ SOLN
30.0000 mg | Freq: Four times a day (QID) | INTRAMUSCULAR | Status: DC | PRN
  Administered 2013-03-16: 30 mg via INTRAVENOUS

## 2013-03-16 MED ORDER — OXYTOCIN 10 UNIT/ML IJ SOLN
INTRAMUSCULAR | Status: AC
  Filled 2013-03-16: qty 4

## 2013-03-16 MED ORDER — NALBUPHINE HCL 10 MG/ML IJ SOLN
5.0000 mg | INTRAMUSCULAR | Status: DC | PRN
  Filled 2013-03-16: qty 1

## 2013-03-16 MED ORDER — MEPERIDINE HCL 25 MG/ML IJ SOLN
INTRAMUSCULAR | Status: DC | PRN
Start: 2013-03-16 — End: 2013-03-16
  Administered 2013-03-16 (×2): 12.5 mg via INTRAVENOUS

## 2013-03-16 MED ORDER — OXYCODONE-ACETAMINOPHEN 5-325 MG PO TABS
1.0000 | ORAL_TABLET | ORAL | Status: DC | PRN
  Administered 2013-03-16 – 2013-03-17 (×3): 1 via ORAL
  Administered 2013-03-18 – 2013-03-19 (×3): 2 via ORAL
  Filled 2013-03-16: qty 2
  Filled 2013-03-16: qty 1
  Filled 2013-03-16: qty 2
  Filled 2013-03-16 (×2): qty 1
  Filled 2013-03-16: qty 2
  Filled 2013-03-16: qty 1
  Filled 2013-03-16: qty 2

## 2013-03-16 MED ORDER — KETOROLAC TROMETHAMINE 30 MG/ML IJ SOLN: 30.0000 mg | Freq: Four times a day (QID) | INTRAMUSCULAR | Status: DC | PRN

## 2013-03-16 MED ORDER — MEPERIDINE HCL 25 MG/ML IJ SOLN
INTRAMUSCULAR | Status: AC
  Filled 2013-03-16: qty 1

## 2013-03-16 MED ORDER — SCOPOLAMINE 1 MG/3DAYS TD PT72
MEDICATED_PATCH | TRANSDERMAL | Status: AC
  Filled 2013-03-16: qty 1

## 2013-03-16 MED ORDER — LACTATED RINGERS IV SOLN
INTRAVENOUS | Status: DC
  Administered 2013-03-16: via INTRAVENOUS

## 2013-03-16 MED ORDER — IBUPROFEN 600 MG PO TABS
600.0000 mg | ORAL_TABLET | Freq: Four times a day (QID) | ORAL | Status: DC
  Administered 2013-03-16 – 2013-03-19 (×11): 600 mg via ORAL
  Filled 2013-03-16 (×11): qty 1

## 2013-03-16 MED ORDER — ZOLPIDEM TARTRATE 5 MG PO TABS
5.0000 mg | ORAL_TABLET | Freq: Every evening | ORAL | Status: DC | PRN
  Administered 2013-03-17: 5 mg via ORAL
  Filled 2013-03-16: qty 1

## 2013-03-16 MED ORDER — MEPERIDINE HCL 25 MG/ML IJ SOLN: 6.2500 mg | INTRAMUSCULAR | Status: DC | PRN

## 2013-03-16 MED ORDER — ONDANSETRON HCL 4 MG/2ML IJ SOLN
INTRAMUSCULAR | Status: AC
  Filled 2013-03-16: qty 2

## 2013-03-16 MED ORDER — SCOPOLAMINE 1 MG/3DAYS TD PT72
1.0000 | MEDICATED_PATCH | Freq: Once | TRANSDERMAL | Status: AC
  Administered 2013-03-16: 1.5 mg via TRANSDERMAL

## 2013-03-16 MED ORDER — HYDROMORPHONE HCL PF 1 MG/ML IJ SOLN: 0.2500 mg | INTRAMUSCULAR | Status: DC | PRN

## 2013-03-16 MED ORDER — FENTANYL CITRATE 0.05 MG/ML IJ SOLN
INTRAMUSCULAR | Status: DC | PRN
  Administered 2013-03-16: 25 ug via INTRAVENOUS
  Administered 2013-03-16: 25 ug via INTRATHECAL

## 2013-03-16 MED ORDER — SENNOSIDES-DOCUSATE SODIUM 8.6-50 MG PO TABS
2.0000 | ORAL_TABLET | ORAL | Status: DC
  Administered 2013-03-16 – 2013-03-19 (×3): 2 via ORAL
  Filled 2013-03-16 (×3): qty 2

## 2013-03-16 MED ORDER — DIPHENHYDRAMINE HCL 25 MG PO CAPS: 25.0000 mg | ORAL_CAPSULE | ORAL | Status: DC | PRN

## 2013-03-16 MED ORDER — MORPHINE SULFATE (PF) 0.5 MG/ML IJ SOLN
INTRAMUSCULAR | Status: DC | PRN
  Administered 2013-03-16: .15 mg via INTRATHECAL

## 2013-03-16 MED ORDER — MEASLES, MUMPS & RUBELLA VAC ~~LOC~~ INJ: 0.5000 mL | INJECTION | Freq: Once | SUBCUTANEOUS | Status: DC

## 2013-03-16 MED ORDER — MORPHINE SULFATE 0.5 MG/ML IJ SOLN
INTRAMUSCULAR | Status: AC
  Filled 2013-03-16: qty 10

## 2013-03-16 MED ORDER — CEFAZOLIN SODIUM-DEXTROSE 2-3 GM-% IV SOLR
INTRAVENOUS | Status: AC
  Filled 2013-03-16: qty 50

## 2013-03-16 MED ORDER — BISACODYL 10 MG RE SUPP: 10.0000 mg | Freq: Every day | RECTAL | Status: DC | PRN

## 2013-03-16 MED ORDER — CITRIC ACID-SODIUM CITRATE 334-500 MG/5ML PO SOLN
ORAL | Status: AC
  Administered 2013-03-16: 30 mL
  Filled 2013-03-16: qty 15

## 2013-03-16 MED ORDER — METOCLOPRAMIDE HCL 5 MG/ML IJ SOLN: 10.0000 mg | Freq: Three times a day (TID) | INTRAMUSCULAR | Status: DC | PRN

## 2013-03-16 MED ORDER — PHENYLEPHRINE HCL 10 MG/ML IJ SOLN
INTRAMUSCULAR | Status: AC
  Filled 2013-03-16: qty 1

## 2013-03-16 MED ORDER — KETOROLAC TROMETHAMINE 30 MG/ML IJ SOLN
INTRAMUSCULAR | Status: AC
  Filled 2013-03-16: qty 1

## 2013-03-16 MED ORDER — OXYTOCIN 40 UNITS IN LACTATED RINGERS INFUSION - SIMPLE MED: 62.5000 mL/h | INTRAVENOUS | Status: AC

## 2013-03-16 MED ORDER — ONDANSETRON HCL 4 MG/2ML IJ SOLN
4.0000 mg | INTRAMUSCULAR | Status: DC | PRN
  Administered 2013-03-16: 4 mg via INTRAVENOUS
  Filled 2013-03-16: qty 2

## 2013-03-16 MED ORDER — BUPIVACAINE IN DEXTROSE 0.75-8.25 % IT SOLN
INTRATHECAL | Status: DC | PRN
  Administered 2013-03-16: 1.5 mL via INTRATHECAL

## 2013-03-16 MED ORDER — DIPHENHYDRAMINE HCL 50 MG/ML IJ SOLN: 25.0000 mg | INTRAMUSCULAR | Status: DC | PRN

## 2013-03-16 MED ORDER — SIMETHICONE 80 MG PO CHEW
80.0000 mg | CHEWABLE_TABLET | ORAL | Status: DC | PRN
  Filled 2013-03-16: qty 1

## 2013-03-16 MED ORDER — LANOLIN HYDROUS EX OINT
1.0000 | TOPICAL_OINTMENT | CUTANEOUS | Status: DC | PRN
Start: 2013-03-16 — End: 2013-03-19

## 2013-03-16 MED ORDER — PHENYLEPHRINE 8 MG IN D5W 100 ML (0.08MG/ML) PREMIX OPTIME
INJECTION | INTRAVENOUS | Status: DC | PRN
  Administered 2013-03-16: 60 ug/min via INTRAVENOUS

## 2013-03-16 MED ORDER — SIMETHICONE 80 MG PO CHEW
80.0000 mg | CHEWABLE_TABLET | Freq: Three times a day (TID) | ORAL | Status: DC
  Administered 2013-03-16 – 2013-03-19 (×7): 80 mg via ORAL
  Filled 2013-03-16 (×7): qty 1

## 2013-03-16 MED ORDER — IBUPROFEN 600 MG PO TABS
600.0000 mg | ORAL_TABLET | Freq: Four times a day (QID) | ORAL | Status: DC
  Filled 2013-03-16: qty 1

## 2013-03-16 MED ORDER — TETANUS-DIPHTH-ACELL PERTUSSIS 5-2.5-18.5 LF-MCG/0.5 IM SUSP: 0.5000 mL | Freq: Once | INTRAMUSCULAR | Status: DC

## 2013-03-16 MED ORDER — MENTHOL 3 MG MT LOZG: 1.0000 | LOZENGE | OROMUCOSAL | Status: DC | PRN

## 2013-03-16 MED ORDER — NALOXONE HCL 0.4 MG/ML IJ SOLN: 0.4000 mg | INTRAMUSCULAR | Status: DC | PRN

## 2013-03-16 MED ORDER — DIPHENHYDRAMINE HCL 25 MG PO CAPS: 25.0000 mg | ORAL_CAPSULE | Freq: Four times a day (QID) | ORAL | Status: DC | PRN

## 2013-03-16 MED ORDER — CEFAZOLIN SODIUM-DEXTROSE 2-3 GM-% IV SOLR
INTRAVENOUS | Status: DC | PRN
  Administered 2013-03-16: 2 g via INTRAVENOUS

## 2013-03-16 MED ORDER — FLEET ENEMA 7-19 GM/118ML RE ENEM: 1.0000 | ENEMA | Freq: Every day | RECTAL | Status: DC | PRN

## 2013-03-16 MED ORDER — FERROUS SULFATE 325 (65 FE) MG PO TABS
325.0000 mg | ORAL_TABLET | Freq: Two times a day (BID) | ORAL | Status: DC
  Administered 2013-03-17 – 2013-03-19 (×5): 325 mg via ORAL
  Filled 2013-03-16 (×5): qty 1

## 2013-03-16 MED ORDER — ONDANSETRON HCL 4 MG/2ML IJ SOLN: 4.0000 mg | Freq: Three times a day (TID) | INTRAMUSCULAR | Status: DC | PRN

## 2013-03-16 MED ORDER — PRENATAL MULTIVITAMIN CH
1.0000 | ORAL_TABLET | Freq: Every day | ORAL | Status: DC
  Administered 2013-03-17 – 2013-03-18 (×2): 1 via ORAL
  Filled 2013-03-16 (×3): qty 1

## 2013-03-16 MED ORDER — SIMETHICONE 80 MG PO CHEW: 80.0000 mg | CHEWABLE_TABLET | ORAL | Status: DC

## 2013-03-16 MED ORDER — DIPHENHYDRAMINE HCL 50 MG/ML IJ SOLN: 12.5000 mg | INTRAMUSCULAR | Status: DC | PRN

## 2013-03-16 MED ORDER — ONDANSETRON HCL 4 MG PO TABS
4.0000 mg | ORAL_TABLET | ORAL | Status: DC | PRN
  Administered 2013-03-19: 4 mg via ORAL
  Filled 2013-03-16: qty 1

## 2013-03-16 MED ORDER — SODIUM CHLORIDE 0.9 % IV SOLN
3.0000 g | Freq: Four times a day (QID) | INTRAVENOUS | Status: DC
  Administered 2013-03-16 – 2013-03-18 (×8): 3 g via INTRAVENOUS
  Filled 2013-03-16 (×9): qty 3

## 2013-03-16 MED ORDER — DIBUCAINE 1 % RE OINT: 1.0000 "application " | TOPICAL_OINTMENT | RECTAL | Status: DC | PRN

## 2013-03-16 MED ORDER — LACTATED RINGERS IV SOLN
INTRAVENOUS | Status: DC | PRN
  Administered 2013-03-16 (×3): via INTRAVENOUS

## 2013-03-16 MED ORDER — OXYTOCIN 10 UNIT/ML IJ SOLN
40.0000 [IU] | INTRAVENOUS | Status: DC | PRN
  Administered 2013-03-16: 40 [IU] via INTRAVENOUS

## 2013-03-16 MED ORDER — FENTANYL CITRATE 0.05 MG/ML IJ SOLN
INTRAMUSCULAR | Status: AC
  Filled 2013-03-16: qty 2

## 2013-03-16 MED ORDER — ONDANSETRON HCL 4 MG/2ML IJ SOLN
INTRAMUSCULAR | Status: DC | PRN
  Administered 2013-03-16: 4 mg via INTRAVENOUS

## 2013-03-16 MED ORDER — SODIUM CHLORIDE 0.9 % IJ SOLN
INTRAMUSCULAR | Status: AC
  Filled 2013-03-16: qty 10

## 2013-03-16 MED ORDER — SODIUM CHLORIDE 0.9 % IJ SOLN: 3.0000 mL | INTRAMUSCULAR | Status: DC | PRN

## 2013-03-16 SURGICAL SUPPLY — 42 items
APL SKNCLS STERI-STRIP NONHPOA (GAUZE/BANDAGES/DRESSINGS) ×1
BENZOIN TINCTURE PRP APPL 2/3 (GAUZE/BANDAGES/DRESSINGS) ×2 IMPLANT
CLAMP CORD UMBIL (MISCELLANEOUS) IMPLANT
CLOTH BEACON ORANGE TIMEOUT ST (SAFETY) ×2 IMPLANT
CONTAINER PREFILL 10% NBF 15ML (MISCELLANEOUS) IMPLANT
DRAIN JACKSON PRT FLT 10 (DRAIN) IMPLANT
DRAPE LG THREE QUARTER DISP (DRAPES) ×4 IMPLANT
DRSG OPSITE POSTOP 4X10 (GAUZE/BANDAGES/DRESSINGS) ×2 IMPLANT
DURAPREP 26ML APPLICATOR (WOUND CARE) ×2 IMPLANT
ELECT REM PT RETURN 9FT ADLT (ELECTROSURGICAL) ×2
ELECTRODE REM PT RTRN 9FT ADLT (ELECTROSURGICAL) ×1 IMPLANT
EVACUATOR SILICONE 100CC (DRAIN) IMPLANT
EXTRACTOR VACUUM M CUP 4 TUBE (SUCTIONS) IMPLANT
GLOVE BIO SURGEON STRL SZ 6.5 (GLOVE) ×2 IMPLANT
GLOVE BIOGEL PI IND STRL 7.0 (GLOVE) ×1 IMPLANT
GLOVE BIOGEL PI INDICATOR 7.0 (GLOVE) ×1
GOWN PREVENTION PLUS XLARGE (GOWN DISPOSABLE) ×2 IMPLANT
GOWN STRL REIN XL XLG (GOWN DISPOSABLE) ×2 IMPLANT
HEMOSTAT SURGICEL 2X3 (HEMOSTASIS) ×1 IMPLANT
KIT ABG SYR 3ML LUER SLIP (SYRINGE) IMPLANT
NDL HYPO 25X5/8 SAFETYGLIDE (NEEDLE) IMPLANT
NEEDLE HYPO 25X5/8 SAFETYGLIDE (NEEDLE) IMPLANT
NS IRRIG 1000ML POUR BTL (IV SOLUTION) ×2 IMPLANT
PACK C SECTION WH (CUSTOM PROCEDURE TRAY) ×2 IMPLANT
PAD ABD 7.5X8 STRL (GAUZE/BANDAGES/DRESSINGS) ×1 IMPLANT
PAD OB MATERNITY 4.3X12.25 (PERSONAL CARE ITEMS) ×2 IMPLANT
RETAINER VISCERAL (MISCELLANEOUS) ×1 IMPLANT
RTRCTR C-SECT PINK 25CM LRG (MISCELLANEOUS) ×1 IMPLANT
STAPLER VISISTAT 35W (STAPLE) IMPLANT
STRIP CLOSURE SKIN 1/2X4 (GAUZE/BANDAGES/DRESSINGS) ×2 IMPLANT
SUT CHROMIC 0 CT 1 (SUTURE) ×2 IMPLANT
SUT MNCRL AB 3-0 PS2 27 (SUTURE) IMPLANT
SUT PLAIN 2 0 (SUTURE) ×4
SUT PLAIN 2 0 XLH (SUTURE) ×2 IMPLANT
SUT PLAIN ABS 2-0 CT1 27XMFL (SUTURE) ×2 IMPLANT
SUT SILK 2 0 SH (SUTURE) IMPLANT
SUT VIC AB 0 CTX 36 (SUTURE) ×8
SUT VIC AB 0 CTX36XBRD ANBCTRL (SUTURE) ×4 IMPLANT
TAPE HYPAFIX 4 X10 (GAUZE/BANDAGES/DRESSINGS) ×1 IMPLANT
TOWEL OR 17X24 6PK STRL BLUE (TOWEL DISPOSABLE) ×2 IMPLANT
TRAY FOLEY CATH 14FR (SET/KITS/TRAYS/PACK) ×2 IMPLANT
WATER STERILE IRR 1000ML POUR (IV SOLUTION) ×2 IMPLANT

## 2013-03-16 NOTE — Progress Notes (Signed)
Provided patient with education regarding benefits of providing breast milk to baby in NICU.  Discussed starting pumping before 4pm this evening and supply and demand of milk production.  Patient stated "I will let you know".  Requested to spend time with deceased baby.  House coverage notified and baby brought to room per request.  Will continue to monitor.  Osvaldo Angst, RN------------

## 2013-03-16 NOTE — Anesthesia Procedure Notes (Signed)
Spinal  Patient location during procedure: OR Start time: 03/16/2013 10:10 AM Staffing Anesthesiologist: FOSTER, MICHAEL A. Performed by: anesthesiologist  Preanesthetic Checklist Completed: patient identified, site marked, surgical consent, pre-op evaluation, timeout performed, IV checked, risks and benefits discussed and monitors and equipment checked Spinal Block Patient position: sitting Prep: site prepped and draped and DuraPrep Patient monitoring: heart rate, cardiac monitor, continuous pulse ox and blood pressure Approach: midline Location: L3-4 Injection technique: single-shot Needle Needle type: Sprotte  Needle gauge: 24 G Needle length: 9 cm Needle insertion depth: 6 cm Assessment Sensory level: T4 Additional Notes Patient tolerated procedure well. Adequate sensory level.

## 2013-03-16 NOTE — Progress Notes (Signed)
Baby B fhr 143bpm

## 2013-03-16 NOTE — Plan of Care (Signed)
Problem: Phase I Progression Outcomes Goal: Pain controlled with appropriate interventions Outcome: Completed/Met Date Met:  03/16/13 Good pain control on Motrin at this time.  Problem: Phase II Progression Outcomes Goal: Progress activity as tolerated unless otherwise ordered Outcome: Completed/Met Date Met:  03/16/13 Has been up several times walking around in room or sitting in a chair.

## 2013-03-16 NOTE — Transfer of Care (Signed)
Immediate Anesthesia Transfer of Care Note  Patient: Stacy [name]  Procedure(s) Performed: Procedure(s): CESAREAN SECTION for twins, baby A Fetal Demise, baby B Nonreassuring fetal heart rate, Chorioamnionitis (N/A)  Patient Location: PACU  Anesthesia Type:Spinal  Level of Consciousness: awake, alert  and oriented  Airway & Oxygen Therapy: Patient Spontanous Breathing and Patient connected to nasal cannula oxygen  Post-op Assessment: Report given to PACU RN and Post -op Vital signs reviewed and stable  Post vital signs: Reviewed and stable  Complications: No apparent anesthesia complications

## 2013-03-16 NOTE — Anesthesia Preprocedure Evaluation (Addendum)
Anesthesia Evaluation  Patient identified by MRN, date of birth, ID band Patient awake    Reviewed: Allergy & Precautions, H&P , NPO status , Patient's Chart, lab work & pertinent test results  Airway Mallampati: III TM Distance: >3 FB Neck ROM: Full    Dental no notable dental hx. (+) Teeth Intact   Pulmonary Recent URI , Residual Cough, Current Smoker,  breath sounds clear to auscultation  Pulmonary exam normal       Cardiovascular negative cardio ROS  Rhythm:Regular Rate:Normal     Neuro/Psych negative neurological ROS  negative psych ROS   GI/Hepatic GERD-  Medicated and Controlled,(+)     substance abuse  marijuana use,   Endo/Other  negative endocrine ROSPCOS  Renal/GU negative Renal ROS  negative genitourinary   Musculoskeletal negative musculoskeletal ROS (+)   Abdominal   Peds  Hematology negative hematology ROS (+)   Anesthesia Other Findings   Reproductive/Obstetrics (+) Pregnancy Twin Gestation 23 weeks PROM IUFD Twin A Sepsis                          Anesthesia Physical Anesthesia Plan  ASA: II and emergent  Anesthesia Plan: Spinal   Post-op Pain Management:    Induction: Intravenous  Airway Management Planned: Natural Airway  Additional Equipment:   Intra-op Plan:   Post-operative Plan:   Informed Consent: I have reviewed the patients History and Physical, chart, labs and discussed the procedure including the risks, benefits and alternatives for the proposed anesthesia with the patient or authorized representative who has indicated his/her understanding and acceptance.     Plan Discussed with: Anesthesiologist, CRNA and Surgeon  Anesthesia Plan Comments:         Anesthesia Quick Evaluation

## 2013-03-16 NOTE — Anesthesia Postprocedure Evaluation (Signed)
  Anesthesia Post-op Note  Patient: Stacy [name]  Procedure(s) Performed: Procedure(s): CESAREAN SECTION for twins, baby A Fetal Demise, baby B Nonreassuring fetal heart rate, Chorioamnionitis (N/A)  Patient Location: PACU  Anesthesia Type:Spinal  Level of Consciousness: awake, alert  and oriented  Airway and Oxygen Therapy: Patient Spontanous Breathing  Post-op Pain: none  Post-op Assessment: Post-op Vital signs reviewed, Patient's Cardiovascular Status Stable, Respiratory Function Stable, Patent Airway, No signs of Nausea or vomiting, Pain level controlled, No headache and No backache  Post-op Vital Signs: Reviewed and stable  Complications: No apparent anesthesia complications

## 2013-03-16 NOTE — Progress Notes (Signed)
Hospital day # 12 pregnancy at [redacted]w[redacted]d  S: Called by RN to see pt due to inability to obtain fetal heart tones on Twin A.  Pt also with c/o chills, body aches               generalized pain as well as increased pelvic pressure and increased cramping/uterine contractions every few       minutes.   O: BP 119/60  Pulse 122  Temp(Src) 100.4 F (38 C) (Oral)  Resp 16  Ht 5\' 4"  (1.626 m)  Wt 77.61 kg (171 lb 1.6 oz)  BMI 29.35 kg/m2  SpO2 97%       Pt moaning and appears anxious.  Pt hyperventilating.         Bedside ultrasound performed with IUFD of Twin A noted.  Repeat bedside ultrasound performed by [facility]                      sonographer ultrasound with confirmation of IUFD.  Twin B noted to be in transverse lie with FHTs 200bpm       Twin B FHTs 170s-180s ext EFM.        No UCs noted on toco        SVE performed by RN: 1.5cm/80%/Vtx/-1          Extremities: extremities normal, atraumatic, no cyanosis or edema, Homans sign is negative, no sign of DVT and no significant edema and no signs of DVT  A: [redacted]w[redacted]d with Twin pregnancy     Cervical insufficiency     IUFD Twin A     Chorioamnionitis     Twin B tachycardia      P: Dr. Normand Sloop already notified of pt status with fever and IUFD.  Dr. Normand Sloop to bedside.  Due to chorioamnionitis, RBA     C/S delivery d/w pt by Dr. Normand Sloop and agrees to proceed after pt discussed with MFM as well.    Sargon Scouten O., CNM 03/16/2013 9:54 AM

## 2013-03-16 NOTE — Progress Notes (Signed)
Chaplain informed at 22 that Ms Stacy [name] was being taken for delivery of twins due to there not being a heart beat in one of her twins. These are her first children.   Ms Stacy [name] is still basically in shock over the death of one of her twins. The fact his death was not made real after she requested to see her dead child. Because of his prematurity, she has not had time with her other child and bond with him.. What Ms Stacy [name] is experiencing is a very high at the live birth of one child and the very low because of the death of her other child. It is expected that after the shock of the death of one of her children she may go into deep grief. She has been told that there are chaplains available at all hours, and was encouraged to call for spiritual care whenever she needs such.  Because of family being in the room a full spiritual assessment could not be made at this time. Ms Stacy [name] is holding up for them and may not be fully open to expressing her current spiritual distress. It is recommended that daytime chaplains follow up and complete this assessment.  Chaplain was asked to stop by NICU to ask about her premature son. He will pray there as close as possible to the child for a blessing.  Chaplain follow up indicated and recommended.  Benjie Karvonen. Kiaria Quinnell, DMin, Chaplain

## 2013-03-16 NOTE — Op Note (Signed)
Cesarean Section Procedure Note   Stacy [name]  03/04/2013 - 03/16/2013  Indications: 1. chorioamnionitis     2. PPROM baby A   3. PTL   4. Fetal Demise baby A   5. Fetal tachycardia and tranverse position Baby B  Pre-operative Diagnosis: twins, baby A Fetal Demise, baby B Nonreassuring fetal heart rate, Chorioamnionitis.   Post-operative Diagnosis: Same   Surgeon: Surgeon(s) and Role:    * Michael Litter, MD - Primary   Assistants: Conni Elliot CNM  Anesthesia: spinal   Procedure Details:  The patient was seen in the Holding Room. The risks, benefits, complications, treatment options, and expected outcomes were discussed with the patient. The patient concurred with the proposed plan, giving informed consent. identified as Stacy [name] and the procedure verified as C-Section Delivery. A Time Out was held and the above information confirmed.  After induction of anesthesia, the patient was draped and prepped in the usual sterile manner. A transverse incision was made and carried down through the subcutaneous tissue to the fascia. Fascial incision was made in the midline and extended transversely. The fascia was separated from the underlying rectus muscle superiorly and inferiorly. The peritoneum was identified and entered. Peritoneal incision was extended longitudinally with good visualization of bowel and bladder. The utero-vesical peritoneal reflection was incised transversely and the bladder flap was bluntly freed from the lower uterine segment.  An alexsis retractor was placed in the abdomen.   A low transverse uterine incision was made. Delivered from cephalic presentation was a  Infant A, with Apgar scores of 0 at one minute and 0 at five minutes.  There was a foul odor present.  Baby B was gently grasped while amnion intact and verted to vtx postion.  The amnion was ruptured.  Clear fluid noted.  Baby was born. Cord clamped and cut and baby taken to the neonatologist present at  birth.   Cord ph was sent the umbilical cord was clamped and cut cord blood was obtained for evaluation. The placenta was removed Intact and appeared normal. The uterine outline, tubes and ovaries appeared normal}. The uterine incision was closed with running locked sutures of 0Vicryl. A second layer 0 vicrlyl was used to imbricate the uterine incision    Hemostasis was observed. Lavage was carried until clear.  The alexsis was removed.   Surgicel was placed along the uterine incision.  A fish was placed in the abdomen to retract the omentum and bowels.  The peritoneum was closed with 0 chromic. The fish was removed.    The muscles were examined and any bleeders were made hemostatic using bovie cautery device.   The fascia was then reapproximated with running sutures of 0 vicryl.  The subcutaneous tissue was reapproximated  With interrupted stitches using 2-0 plain gut. The subcuticular closure was performed using 3-44monocryl     Instrument, sponge, and needle counts were correct prior the abdominal closure and were correct at the conclusion of the case.    Findings: infant was delivered from vtx presentation. The fluid was minimal.  There was a foul odor present.  Baby B TVS clear fluid.   The uterus tubes and ovaries appeared normal.     Estimated Blood Loss:  Total IV Fluids:   Urine Output: 600CC OF clear urine  Specimens: placenta to pathology  Complications: no complications  Disposition: PACU - hemodynamically stable.   Maternal Condition: stable   Baby condition / location:  Baby A to morgue  Attending Attestation: I was present and scrubbed for the entire procedure.   Signed: Surgeon(s): Michael Litter, MD

## 2013-03-16 NOTE — Progress Notes (Signed)
[place] here for location of FHR A. Baby B is in the 190s

## 2013-03-16 NOTE — Consult Note (Signed)
Neonatology Note:  Attendance at C-section:  I was asked by Dr. Dillard to attend this primary C/S at 23 4/7 weeks due to transverse lie. The mother is a G2P0A1 B pos, GBS not found with Di-Di twins. She was admitted on 10/21 with incompetent cervix in PTL. She was treated with antibiotics and progesterone. There was SROM at 22 2/7 weeks for Twin A. She received Betamethasone on 10/29-30. She also received Indomethacin from 10/22-29. She had onset of fever and increased feelings of pressure this morning. Ultrasound showed IUFD of Twin A. Decision was made to proceed to C/S delivery of Twin B. FHR of Twin was in the 190s, otherwise no distress.  ROM at delivery, fluid clear. Infant with some movement and respiratory effort, HR normal. We dried him and placed him quickly into the portawarmer bag. He was breathing some on his own, so we bulb suctioned and placed the neopuff on him, giving some PPV breaths. Pulse oximetry showed low O2 saturations and the HR began to go below 100, so I intubated him with a 2.5 mm ETT on the first attempt, completed at 3 minutes. The CO2 detector turned yellow immediately and the HR came up to 120. O2 saturations gradually rose into the 90s, after which we weaned the FIO2. We secured the ETT at 6.5 cm at the lip, hearing good breath sounds bilaterally. We gave 1.5 ml of Infasurf via the ETT at 10 minutes of life, which was tolerated very well, and we were able to wean the FIO2 to 21% just after that. Ap 5/8. The baby was seen briefly by his parents in the OR. Transported to NICU being bagged via ETT, for further care.  Adonai Selsor C. Alexio Sroka, MD  

## 2013-03-16 NOTE — Progress Notes (Signed)
Patient ID: Smitty Pluck, female   DOB: 1980-10-31, 32 y.o.   MRN: [id number] Pt is shaking with chills. She feels she has a cough.  She has body aches all over. BP 119/60  Pulse 122  Temp(Src) 100.4 F (38 C) (Oral)  Resp 16  Ht 5\' 4"  (1.626 m)  Wt 171 lb 1.6 oz (77.61 kg)  BMI 29.35 kg/m2  SpO2 97% Physical Examination: General appearance - alert, well appearing, and in no distress, anxious and crying Chest - clear to auscultation, no wheezes, rales or rhonchi, symmetric air entry Heart - no murmurs noted, no gallops noted Abdomen - soft, nontender, nondistended, no masses or organomegaly Pelvic - cx 1/80/-3 [place] demise of baby A Baby B TVS HR 170 Chorioamnionitis Panic Attack  unasyn Tylenol Plan for CS for delivery.  Baby is TVS .  Spoke with DR Otho Perl who agrees with the plan Pt and her husband made aware of the plan.  They agree with the plan  CS consent signed Type and cross 2 units pt is anemic R&B reviewed with the pt NICU notified.   Pt given O2 with deep breathing for anxiety.  She may need an anxiolytic after delivery

## 2013-03-16 NOTE — Progress Notes (Signed)
Plan of care discussed with patient by physician.  Patient agreeable to primary cesarean section.

## 2013-03-16 NOTE — Progress Notes (Signed)
Attempted follow up visit with mother.  She was not in the room.  Spoke with RN and informed that baby A had no heartbeat and mother was taken for C/S.

## 2013-03-17 ENCOUNTER — Encounter (HOSPITAL_COMMUNITY): Payer: Self-pay | Admitting: Obstetrics and Gynecology

## 2013-03-17 ENCOUNTER — Inpatient Hospital Stay (HOSPITAL_COMMUNITY): Payer: Medicaid Other

## 2013-03-17 LAB — CBC
HCT: 24.3 % — ABNORMAL LOW (ref 36.0–46.0)
Hemoglobin: 8.2 g/dL — ABNORMAL LOW (ref 12.0–15.0)
MCHC: 33.7 g/dL (ref 30.0–36.0)
MCV: 92.4 fL (ref 78.0–100.0)
WBC: 32.9 10*3/uL — ABNORMAL HIGH (ref 4.0–10.5)

## 2013-03-17 NOTE — Progress Notes (Signed)
I provided follow-up grief support to pt after the loss of one of her babies.  We went down to the NICU to visit her son there.  Her aunt accompanied [place] and her aunt was very emotional.  Yolani reported that she is strengthened by visiting her son there--it gives her hope and joy.    As was mentioned by Tilford Pillar Lumpkin, it was difficult to do a spiritual assessment of pt with family present.  We will continue to support her and follow up with her, but please also page as needs arise.  Chaplain Dyanne Carrel Pager [phone] 2:40 PM   03/17/13 1400  Clinical Encounter Type  Visited With Patient and family together  Visit Type Spiritual support  Referral From Nurse  Spiritual Encounters  Spiritual Needs Grief support;Emotional  Stress Factors  Patient Stress Factors Loss

## 2013-03-17 NOTE — Lactation Note (Signed)
This note was copied from the chart of Stacy [name]. Lactation Consultation Note Initial consultation in mom's room, FOB present. Baby Stacy now 10 hours old, 23 weeks 5 days adjusted gestational age. Baby's twin IUFD. Mom was just setting up her pump when I enter room; states she has been pumping every 3 hours. Discussed hand expression with mom, mom request help learning how to hand express. Discussed and demonstrated hand expression, small drops colostrum from right side. Inst mom to continue to pump at least 8 times a day followed by 3 to 4 minutes of hand expression; discussed the reason for doing so. Written instructions provided. Mom and dad both return demonstration on using the pump and hand expression. Colostrum bottles provided.  Mom will need to rent a pump until hers is provided by insurance. Pump packet given.  Reviewed the Breastfeeding for NICU booklet with mom and dad. Reviewed lactation brochure and community resources. Discussed our outpatient services and BFSG. Enc mom that lactation support is available to her, and will follow up with her in NICU to assist with latch, STS, and eventually breastfeeding baby.   Patient Name: Stacy [name] [id number] Date: 03/17/2013 Reason for consult: Initial assessment;NICU baby   Maternal Data Formula Feeding for Exclusion: Yes Reason for exclusion: Admission to Intensive Care Unit (ICU) post-partum Infant to breast within first hour of birth: No Breastfeeding delayed due to:: Infant status Has patient been taught Hand Expression?: Yes Does the patient have breastfeeding experience prior to this delivery?: No  Feeding    LATCH Score/Interventions                      Lactation Tools Discussed/Used WIC Program: No Pump Review: Setup, frequency, and cleaning;Milk Storage   Consult Status Consult Status: Follow-up Follow-up type: In-patient    Octavio Manns [facility] 03/17/2013, 2:39 PM

## 2013-03-17 NOTE — Progress Notes (Signed)
Ur chart review completed.  

## 2013-03-17 NOTE — Progress Notes (Signed)
This was a brief follow-up with Stacy [name] who had family visiting.  She reported that they were doing well for the moment.  [employer] Pager, [phone] 3:28 PM   03/17/13 1500  Clinical Encounter Type  Visited With Patient and family together  Visit Type Spiritual support;Follow-up

## 2013-03-17 NOTE — Progress Notes (Signed)
Clinical Social Work Department PSYCHOSOCIAL ASSESSMENT - MATERNAL/CHILD 03/17/2013  Patient:  [name],[name]  Account Number:  [id number]  Admit Date:  03/04/2013  Childs Name:   Twin A-Stacy [name] (IUFD)  Twin B-Stacy [name]    Clinical Social Worker:  Kionna Brier, LCSW   Date/Time:  03/17/2013 11:00 AM  Date Referred:  03/17/2013   Referral source  NICU  RN     Referred reason  NICU  Crisis Intervention   Other referral source:    I:  FAMILY / HOME ENVIRONMENT Child's legal guardian:  PARENT  Guardian - Name Guardian - Age Guardian - Address  Stacy [name] 31 [address] [place], [place] [address]  Stacy [name]  same   Other household support members/support persons Other support:   MOB states she currently lives with her best friend in [place].  Couple is in the process of moving to [place] and MOB has been working here for a year.  FOB travels back and forth and is currently still working for his grandfather back home in [place].    II  PSYCHOSOCIAL DATA Information Source:  Family Interview  Financial and Community Resources Employment:   MOB works as a Clinical assistant at [employer]  FOB works for his granfather's business   Financial resources:  Medicaid If Medicaid - County:    School / Grade:   Maternity Care Coordinator / Child Services Coordination / Early Interventions:   CC4C, CDSA, Early Intervention  Cultural issues impacting care:   None stated    III  STRENGTHS Strengths  Compliance with medical plan  Supportive family/friends  Understanding of illness   Strength comment:    IV  RISK FACTORS AND CURRENT PROBLEMS Current Problem:  YES   Risk Factor & Current Problem Patient Issue Family Issue Risk Factor / Current Problem Comment  Financial Resources N Y   Housing Concerns N Y     V  SOCIAL WORK ASSESSMENT  CSW met with MOB in her third floor room/319 to introduce myself, offer support and condolences and complete assessment  now that she has a son in NICU.  Weekend CSW initially met with patient while she was on [employer] approximately a week ago.  CSW's visit was somewhat brief and disjointed as visitors were in and out of the room and CSW was sensitive to the fact that they are still in shock over the loss of one of their sons and the extreme prematurity of their other.  MOB was very welcoming of CSW.  She states she feels she is coping as well as can be expected at the moment and appears to be calm.  She states she is the "matriarch" of the family and CSW gets the sense that she has had to hold others together during this difficult time when she needs times herself to grieve.  CSW encouraged her to allow hospital staff to limit visitors at some point and she is in agreement.  She will let RN know when she feels she needs time alone.  MOB states FOB is her main support person.  She states she has a best friend of 20 years who is also a great support, and with whom she is currently living.  She states her family is loving and "means well" but it not always who she turns to for support.  She also states that while her best friend is very supportive, she really only relies on FOB for support.  FOB came in to the room soon after CSW   began talking with MOB and first seemed angry, but soon joined the conversation.  MOB asked if he was mad and he quickly softened and sat next to her and held her hand as we all talked.  He states MOB is his greatest support person, although he states his stepfather and father are also supportive.  He admits that he does not like to ask anyone for help and that the two of them only rely on each other.  CSW encouraged them to identify others who they may be able to lean on in this difficult time.  CSW encouraged them to hold each other up and suggested that they may find that when one is feeling weak, the other may find strength and also to remember to allow each other space to grieve.  MOB states she has been  given information on Heartstrings and has been reading about it.  CSW strongly encourages involvement, but recommends engages in this support after about 4-5 months, and possibly longer given having a baby in NICU.  CSW offered assistance with making funeral arrangements and it sounds as though parents have already been making calls and have decided on cremation.  CSW asked parents to allow CSW to make any phone calls for them if it would make things any easier on them.  Parents were very appreciative and told CSW that the hospital staff have been like family to them.  MOB is concerned about finances as she has had to be out of work for three weeks already and has not gotten a short term disability check yet.  She states things are not straightened out, but that she has been in touch with her employer.  CSW acknowledged that parents have been through and are going through a lot of transition at this time in their lives and that they have a lot of things to figure out such as FOB finding employment in [place] and finding housing in [place], but CSW encouraged them to take time now to focus on Stacy [name].  CSW encouraged making lists and asking questions and taking things one day at a time.  CSW also encouraged parents to prioritize and not worry about tasks that do not need to be done right away.  CSW informed parents of baby's eligibility for SSI benefits, but was very realistic about baby's critical state and the fact that he must survive in order to obtain benefits.  FOB started to shake his head and got tears in his eyes.  CSW acknowledged his emotion and asked him to talk about it if he felt able.  MOB encouraged him to talk.  He mumbled that he cannot think about the possibility that twin B might also not make it.  Another visitor arrived and CSW felt we had talked long enough at this time.  CSW is of course concerned about parents' emotional health as they grieve the loss of one twin and cope with the  hospitalization of the other.  CSW is also concerned about their social situation/stability at this time.  CSW gave contact information and encouraged parents to call any time.  CSW also informed them that they may request a family conference at any time and to not be alarmed if the team requests that they meet for a family conference at some point while Stacy [name] in the NICU.  Parents again stated their appreciation for all the support they have received from CSWs and other hospital staff.    VI SOCIAL WORK PLAN Social Work Plan    Psychosocial Support/Ongoing Assessment of Needs   Type of pt/family education:   Ongoing support offered by NICU CSW Baby's eligibility for SSI benefits   If child protective services report - county:   If child protective services report - date:   Information/referral to community resources comment:   Heartstrings   Other social work plan:    

## 2013-03-17 NOTE — Progress Notes (Signed)
Subjective: Postpartum Day 1: Cesarean Delivery Patient reports no nausea, vomiting, incisional pain.  Is tolerating PO and no problems voiding.    Objective: Vital signs in last 24 hours: Temp:  [98 F (36.7 C)-98.4 F (36.9 C)] 98.3 F (36.8 C) (11/03 1200) Pulse Rate:  [62-91] 70 (11/03 1200) Resp:  [16-20] 16 (11/03 1200) BP: (101-117)/(51-74) 110/63 mmHg (11/03 1200) SpO2:  [94 %-100 %] 98 % (11/03 1200)  Physical Exam:  General: alert, cooperative and no distress Lochia: appropriate Uterine Fundus: firm Incision: healing well, no significant drainage, no dehiscence, no significant erythema DVT Evaluation: No evidence of DVT seen on physical exam.  Results for orders placed during the hospital encounter of 03/04/13 (from the past 24 hour(s))  CBC     Status: Abnormal   Collection Time    03/17/13  5:35 AM      Result Value Range   WBC 32.9 (*) 4.0 - 10.5 K/uL   RBC 2.63 (*) 3.87 - 5.11 MIL/uL   Hemoglobin 8.2 (*) 12.0 - 15.0 g/dL   HCT 16.1 (*) 09.6 - 04.5 %   MCV 92.4  78.0 - 100.0 fL   MCH 31.2  26.0 - 34.0 pg   MCHC 33.7  30.0 - 36.0 g/dL   RDW 40.9  81.1 - 91.4 %   Platelets 207  150 - 400 K/uL    Recent Labs  03/16/13 0932 03/17/13 0535  HGB 9.8* 8.2*  HCT 28.0* 24.3*    Assessment/Plan: Cervical insuffieiciency with PPROM Status post Cesarean section. Postoperative course complicated by chorioamnionitis/endometritis  F/U CBC in am.  Alantis Bethune P 03/17/2013, 4:04 PM

## 2013-03-17 NOTE — Anesthesia Postprocedure Evaluation (Signed)
  Anesthesia Post-op Note  Patient: Stacy [name]  Procedure(s) Performed: Procedure(s): CESAREAN SECTION for twins, baby A Fetal Demise, baby B Nonreassuring fetal heart rate, Chorioamnionitis (N/A)  Patient Location: PACU and Women's Unit  Anesthesia Type:Spinal  Level of Consciousness: awake  Airway and Oxygen Therapy: Patient Spontanous Breathing  Post-op Pain: none  Post-op Assessment: Patient's Cardiovascular Status Stable, Respiratory Function Stable, Patent Airway, No signs of Nausea or vomiting, Adequate PO intake and Pain level controlled  Post-op Vital Signs: Reviewed and stable  Complications: No apparent anesthesia complications

## 2013-03-18 LAB — CBC WITH DIFFERENTIAL/PLATELET
Basophils Absolute: 0 10*3/uL (ref 0.0–0.1)
Basophils Relative: 0 % (ref 0–1)
HCT: 24.4 % — ABNORMAL LOW (ref 36.0–46.0)
Lymphs Abs: 1.7 10*3/uL (ref 0.7–4.0)
MCH: 32.2 pg (ref 26.0–34.0)
MCHC: 34.8 g/dL (ref 30.0–36.0)
MCV: 92.4 fL (ref 78.0–100.0)
Monocytes Absolute: 1.4 10*3/uL — ABNORMAL HIGH (ref 0.1–1.0)
Monocytes Relative: 6 % (ref 3–12)
Platelets: 253 10*3/uL (ref 150–400)
RBC: 2.64 MIL/uL — ABNORMAL LOW (ref 3.87–5.11)
WBC: 21.6 10*3/uL — ABNORMAL HIGH (ref 4.0–10.5)

## 2013-03-18 LAB — TYPE AND SCREEN
ABO/RH(D): B POS
Unit division: 0

## 2013-03-18 NOTE — Progress Notes (Signed)
Subjective: Postpartum Day 2: Cesarean Delivery Feeling overwhelmed but coping well Patient reports that pain is well-managed.Lochia normal. Ambulating, voiding, tolerating diet as ordered without difficulty. Normal flatus. Absent bowel movement. Baby is stable in NICU  Objective: Vital signs in last 24 hours: Temp:  [98.2 F (36.8 C)-98.7 F (37.1 C)] 98.2 F (36.8 C) (11/04 1129) afebrile for 36 hours Pulse Rate:  [61-71] 64 (11/04 1129) Resp:  [18] 18 (11/04 1129) BP: (105-120)/(64-72) 116/72 mmHg (11/04 1129) SpO2:  [98 %-100 %] 100 % (11/04 1129)  Physical Exam:  General: alert Lochia: appropriate Uterine Fundus: firm and appropriately tender Incision: healing well DVT Evaluation: No evidence of DVT seen on physical exam. Edema 2+   Recent Labs  03/17/13 0535 03/18/13 0525  HGB 8.2* 8.5*  HCT 24.3* 24.4*    Assessment/Plan: Status post Cesarean section. Doing well postoperatively.  Continue current care. Anticipate discharge tomorrow  Aviyah Swetz A 03/18/2013, 2:42 PM

## 2013-03-18 NOTE — Progress Notes (Signed)
CSW attempted to check in on MOB to offer support, but she was resting.  CSW asked her RN to tell her CSW stopped by and for her to call if she needs anything. 

## 2013-03-18 NOTE — Progress Notes (Signed)
03/18/13 1600  Clinical Encounter Type  Visited With Patient  Visit Type Spiritual support;Social support  Referral From Chaplain  Spiritual Encounters  Spiritual Needs Grief support;Prayer;Emotional   Made lengthy follow-up visit with Stacy [name] to provide further bereavement support.  She is coping well, setting boundaries with family for self-care, using gratitude and faith to cope, and beginning to consider a self-care plan for after discharge.  She found deep reassurance in my affirmation of her pain and grief, especially the human need not to be strong all the time, but to break down, to release feelings, to ask for and to receive help/support.  Provided empathic listening, prayer, hugs, and grief education.  We talked a lot about how her family story, including both Stacy [name] and Stacy [name], is being written right now, which was very comforting to her as a way of participating in meaning-making.  Stacy [name] expressed deep gratitude for all of the chaplains who have been part of her support team.  Plan to follow up tomorrow prior to her discharge.  Please also page as needed:  [phone].  Thank you!  [address] [place], [place] [phone]

## 2013-03-19 ENCOUNTER — Ambulatory Visit: Payer: Self-pay

## 2013-03-19 MED ORDER — NORETHINDRONE 0.35 MG PO TABS: 1.0000 | ORAL_TABLET | Freq: Every day | ORAL | Status: DC

## 2013-03-19 MED ORDER — SENNOSIDES-DOCUSATE SODIUM 8.6-50 MG PO TABS: 2.0000 | ORAL_TABLET | ORAL | Status: DC

## 2013-03-19 MED ORDER — FERROUS SULFATE 325 (65 FE) MG PO TABS: 325.0000 mg | ORAL_TABLET | Freq: Two times a day (BID) | ORAL | Status: DC

## 2013-03-19 MED ORDER — OXYCODONE-ACETAMINOPHEN 5-325 MG PO TABS: 1.0000 | ORAL_TABLET | ORAL | Status: DC | PRN

## 2013-03-19 NOTE — Discharge Summary (Addendum)
  Obstetric Discharge Summary  Reason for Admission: Cervical incompetence at 21+6 weeks of twin pregnancy 03/04/13 Prenatal Procedures: vaginal progesterone and bed rest Intrapartum Procedures: cesarean: low cervical, transverse by Jaymes Graff MD 03/16/13 for PPROM, chorioamnionitis and fetal demise of twin A at 23+4 weeks Postpartum Procedures: antibiotics Complications-Operative and Postpartum: none  Hemoglobin  Date Value Range Status  03/18/2013 8.5* 12.0 - 15.0 g/dL Final     HCT  Date Value Range Status  03/18/2013 24.4* 36.0 - 46.0 % Final    Discharge Diagnoses: Primary cesarean twin delivery at 23+4 weeks with PPROM, chorioamnionitis and fetal demise of twin A  Discharge Information:  Date: 03/19/2013 Activity: unrestricted Diet: routine Medications: Ibuprofen, Colace, Iron and Percocet Condition: stable  Breastfeeding: yes via breast pumping Micronor for contraception  Instructions: refer to practice specific booklet Discharge to: home   Newborn Data: Live born  Information for the patient's newborn:  Ada, Holness [[id number]]  female Information for the patient's newborn:  Aireana, Ryland [[id number]]  female Baby A : Titus  fetal demise  Baby B: Aremis in NICU  [facility] A MD 03/19/2013, 12:24 PM

## 2013-03-19 NOTE — Progress Notes (Signed)
03/19/13 0900  Clinical Encounter Type  Visited With Patient  Visit Type Follow-up   Attempted follow-up visit, but pt (sounding stressed) requested visit later instead.  Will follow up today, but please also page as needed:  [phone].  Thank you!  [address] [place], [place] [phone]

## 2013-03-19 NOTE — Progress Notes (Signed)
03/19/13 1300  Clinical Encounter Type  Visited With Patient and family together  Visit Type Follow-up   Attempted another follow-up visit, but pt was dealing with nausea and vomiting.  We plan for Spiritual Care to follow up in NICU on a different day.  Please page as needed, as well:  [phone].  Thank you.  [address] [place], [place] [phone]

## 2013-03-19 NOTE — Lactation Note (Signed)
This note was copied from the chart of Stacy Kherington Segundo. Lactation Consultation Note     Follow up consult with this mom of a NICU baby, now 36 days old, and 124 corrected weeks gestation. Mom is being discharged and was loaned a [facility] DEP, and instructed in it;s use. Mom has been grieving for the loss of her other twin baby. She has not expressed any colostrum as of yet. I explained to mom that this can be normal, with all the stress she is under. I told her it could take more than the normal 3 days for her milk to transition in, to stay hydrated, and to keep pumping as best she can. Mom knows I will be available to help her with providing EBM   In the NICU>  Patient Name: Stacy [name] [id number] Date: 03/19/2013     Maternal Data    Feeding    LATCH Score/Interventions                      Lactation Tools Discussed/Used     Consult Status      Stacy [name] 03/19/2013, 5:03 PM

## 2013-04-05 ENCOUNTER — Inpatient Hospital Stay (HOSPITAL_COMMUNITY)
Admission: AD | Admit: 2013-04-05 | Discharge: 2013-04-05 | Disposition: A | Discharge status: Home / Self Care | Payer: BC Managed Care – PPO | Source: Ambulatory Visit | Attending: Obstetrics and Gynecology | Admitting: Obstetrics and Gynecology

## 2013-04-05 DIAGNOSIS — O99345 Other mental disorders complicating the puerperium: Secondary | ICD-10-CM | POA: Insufficient documentation

## 2013-04-05 DIAGNOSIS — O909 Complication of the puerperium, unspecified: Secondary | ICD-10-CM | POA: Insufficient documentation

## 2013-04-05 DIAGNOSIS — F3289 Other specified depressive episodes: Secondary | ICD-10-CM | POA: Insufficient documentation

## 2013-04-05 DIAGNOSIS — O99335 Smoking (tobacco) complicating the puerperium: Secondary | ICD-10-CM | POA: Insufficient documentation

## 2013-04-05 DIAGNOSIS — F329 Major depressive disorder, single episode, unspecified: Secondary | ICD-10-CM | POA: Insufficient documentation

## 2013-04-05 MED ORDER — SERTRALINE HCL 25 MG PO TABS: 25.0000 mg | ORAL_TABLET | Freq: Every day | ORAL | Status: DC

## 2013-04-05 MED ORDER — OXYCODONE-ACETAMINOPHEN 5-325 MG PO TABS: 1.0000 | ORAL_TABLET | ORAL | Status: DC | PRN

## 2013-04-05 MED ORDER — ZOLPIDEM TARTRATE 5 MG PO TABS: 5.0000 mg | ORAL_TABLET | Freq: Every evening | ORAL | Status: DC | PRN

## 2013-04-05 NOTE — MAU Provider Note (Signed)
History     CSN: [id number]  Arrival date and time: 04/05/13 4098   None     Chief Complaint  Patient presents with  . pain at incision site    HPI Comments: Pt is 32yo s/p SVD of 24wk IUFD and c-section for 2nd twin that subsequently passed not long after delivery on 03/16/13. She c/o pain at incision and worried it's not healing appropriately. Also very sad and feels overwhelmed trying to cope w loss of her babies. She is tearful. States her family is not very supportive and boyfriend is not sharing his feelings. States she can't sleep and having difficulty getting comfortable. States the percocet does help but is worried about taking it too much.     Past Medical History  Diagnosis Date  . PCOS (polycystic ovarian syndrome)   . Irregular menses   . PCOS (polycystic ovarian syndrome)   . BV (bacterial vaginosis)     Past Surgical History  Procedure Laterality Date  . Endometrial biopsy  2009  . Dilation and curettage of uterus    . Cesarean section N/A 03/16/2013    Procedure: CESAREAN SECTION for twins, baby A Fetal Demise, baby B Nonreassuring fetal heart rate, Chorioamnionitis;  Surgeon: Michael Litter, MD;  Location: [facility];  Service: Obstetrics;  Laterality: N/A;    Family History  Problem Relation Age of Onset  . Mental illness Maternal Aunt   . Diabetes Maternal Uncle     History  Substance Use Topics  . Smoking status: Heavy Tobacco Smoker -- 0.50 packs/day for 15 years    Types: Cigarettes  . Smokeless tobacco: Not on file  . Alcohol Use: Yes     Comment: social drinker not while pregnant    Allergies: No Known Allergies  Prescriptions prior to admission  Medication Sig Dispense Refill  . ferrous sulfate 325 (65 FE) MG tablet Take 1 tablet (325 mg total) by mouth 2 (two) times daily with a meal.  60 tablet  0  . ibuprofen (ADVIL,MOTRIN) 600 MG tablet Take 1 tablet (600 mg total) by mouth every 6 (six) hours.  30 tablet  0  . norethindrone  (MICRONOR,CAMILA,ERRIN) 0.35 MG tablet Take 1 tablet by mouth daily.      Marland Kitchen oxyCODONE-acetaminophen (PERCOCET/ROXICET) 5-325 MG per tablet Take 1-2 tablets by mouth every 4 (four) hours as needed for severe pain (moderate - severe pain).  30 tablet  0  . Prenatal Vit-Fe Fumarate-FA (PRENATAL MULTIVITAMIN) TABS tablet Take 1 tablet by mouth daily at 12 noon.        Review of Systems  Respiratory: Negative for shortness of breath.   Cardiovascular: Negative for chest pain.  Gastrointestinal: Positive for abdominal pain. Negative for nausea, vomiting and constipation.  Neurological: Negative for headaches.  Psychiatric/Behavioral: Positive for depression. Negative for suicidal ideas and substance abuse. The patient has insomnia.   All other systems reviewed and are negative.   Physical Exam   Blood pressure 135/105, pulse 98, temperature 98.7 F (37.1 C), temperature source Oral, unknown if currently breastfeeding.  Physical Exam  Nursing note and vitals reviewed. Constitutional: She is oriented to person, place, and time. She appears well-developed and well-nourished.  Tearful   HENT:  Head: Normocephalic.  Eyes: Pupils are equal, round, and reactive to light.  Neck: Normal range of motion.  Cardiovascular: Normal rate.   Respiratory: Effort normal.  GI: Soft.  Incision is CDI, no redness or drainage at site abd soft, appropriately tender  Appears to be  healing well   Musculoskeletal: Normal range of motion.  Neurological: She is alert and oriented to person, place, and time. She has normal reflexes.  Skin: Skin is warm and dry.  Psychiatric: She has a normal mood and affect. Her behavior is normal.    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  Recommend pt continue to take percocet alternating w ibuprofen Rx zoloft 25mg  x1week then increase to 50mg  rx ambien 5-10mg  PRN for sleep Rx percocet 5/325mg  #30 no  refills  Call or go to [facility] if symptoms worsen or SI/HI  Pt stable at this time for discharge Myself or office will f/u w pt Monday (states she's too overwhelmed to come to office) Declines referral for therapy at this time.    Stacy [name] M 04/05/2013, 10:56 AM

## 2013-04-05 NOTE — MAU Note (Signed)
Pt presents with complaints of pain at her cesarean section site mainly on her right lower side, states she delivered on the 2nd of November.

## 2013-04-05 NOTE — MAU Note (Signed)
Pt complaining of worsening pain at c-section incision site. No drainage or bleeding.

## 2014-03-16 ENCOUNTER — Encounter (HOSPITAL_COMMUNITY): Payer: Self-pay | Admitting: Obstetrics and Gynecology

## 2014-04-12 ENCOUNTER — Inpatient Hospital Stay (HOSPITAL_COMMUNITY)
Admission: AD | Admit: 2014-04-12 | Discharge: 2014-04-12 | Disposition: A | Discharge status: Home / Self Care | Payer: BC Managed Care – PPO | Source: Ambulatory Visit | Attending: Obstetrics and Gynecology | Admitting: Obstetrics and Gynecology

## 2014-04-12 ENCOUNTER — Encounter (HOSPITAL_COMMUNITY): Payer: Self-pay | Admitting: *Deleted

## 2014-04-12 DIAGNOSIS — Z3201 Encounter for pregnancy test, result positive: Secondary | ICD-10-CM | POA: Insufficient documentation

## 2014-04-12 DIAGNOSIS — Z3A00 Weeks of gestation of pregnancy not specified: Secondary | ICD-10-CM | POA: Insufficient documentation

## 2014-04-12 DIAGNOSIS — O2621 Pregnancy care for patient with recurrent pregnancy loss, first trimester: Secondary | ICD-10-CM | POA: Insufficient documentation

## 2014-04-12 DIAGNOSIS — N926 Irregular menstruation, unspecified: Secondary | ICD-10-CM

## 2014-04-12 DIAGNOSIS — A599 Trichomoniasis, unspecified: Secondary | ICD-10-CM | POA: Insufficient documentation

## 2014-04-12 DIAGNOSIS — O99331 Smoking (tobacco) complicating pregnancy, first trimester: Secondary | ICD-10-CM | POA: Insufficient documentation

## 2014-04-12 DIAGNOSIS — O98311 Other infections with a predominantly sexual mode of transmission complicating pregnancy, first trimester: Secondary | ICD-10-CM | POA: Insufficient documentation

## 2014-04-12 LAB — URINALYSIS, ROUTINE W REFLEX MICROSCOPIC
Bilirubin Urine: NEGATIVE
GLUCOSE, UA: NEGATIVE mg/dL
Ketones, ur: NEGATIVE mg/dL
Leukocytes, UA: NEGATIVE
Nitrite: NEGATIVE
PROTEIN: NEGATIVE mg/dL
Specific Gravity, Urine: 1.025 (ref 1.005–1.030)
UROBILINOGEN UA: 0.2 mg/dL (ref 0.0–1.0)
pH: 6.5 (ref 5.0–8.0)

## 2014-04-12 LAB — WET PREP, GENITAL
Clue Cells Wet Prep HPF POC: NONE SEEN
YEAST WET PREP: NONE SEEN

## 2014-04-12 LAB — URINE MICROSCOPIC-ADD ON

## 2014-04-12 LAB — POCT PREGNANCY, URINE: Preg Test, Ur: POSITIVE — AB

## 2014-04-12 MED ORDER — METRONIDAZOLE 500 MG PO TABS
2000.0000 mg | ORAL_TABLET | Freq: Once | ORAL | Status: AC
Start: 2014-04-12 — End: 2014-04-26

## 2014-04-12 NOTE — Discharge Instructions (Signed)

## 2014-04-12 NOTE — MAU Provider Note (Signed)
Stacy [name] is a 33 y.o. G5P0130 at unknown weeks.  Pt reports 1) she took a positive home pregnancy test, 2) she has a hs of reoccur ant BV x4 years, 3) Hx of PCOS, 4) she has moved away from the area 4 hours away but want to see [facility] for care if she is pregnant. 5) hs of 3 SAB early in the pregnancy and a CS PTD of twin boys at 26 weeks.  One was IUFD and the other was a fetal dimise at day 8.  The twins were delivered by Dr Normand Sloopillard.  She is in town for her uncles funeral and came to MAU bc she has BV symptoms (discharge and foul odor), for pregnancy confirmation and to start care.  She report she has taken Flagyl on many occasion.  [place] 2 pill after intercourse.  The last time she used Flagyl was January 2015.  We reviewed the benefits for a local OB provider in her area.  [place] and/or stay in town if needed.    History     Patient Active Problem List   Diagnosis Date Noted  . Antepartum chorioamnionitis 03/17/2013  . PROM (premature rupture of membranes) 03/07/2013  . Cervical insufficiency w/PPROM 03/07/13. 03/06/2013  . Anemia 03/06/2013  . Twins 01/08/2013  . History of PCOS 01/08/2013  . Irregular menses 01/08/2013    Chief Complaint  Patient presents with  . +HPT   . Treatment for BV   . Pelvic Pain   HPI  OB History    Gravida Para Term Preterm AB TAB SAB Ectopic Multiple Living   4 1 0 1 3 0 3 0 1 1       Past Medical History  Diagnosis Date  . PCOS (polycystic ovarian syndrome)   . Irregular menses   . PCOS (polycystic ovarian syndrome)   . BV (bacterial vaginosis)     Past Surgical History  Procedure Laterality Date  . Endometrial biopsy  2009  . Dilation and curettage of uterus    . Cesarean section N/A 03/16/2013    Procedure: CESAREAN SECTION for twins, baby A Fetal Demise, baby B Nonreassuring fetal heart rate, Chorioamnionitis;   Surgeon: Michael LitterNaima A Dillard, MD;  Location: [facility];  Service: Obstetrics;  Laterality: N/A;    Family History  Problem Relation Age of Onset  . Mental illness Maternal Aunt   . Diabetes Maternal Uncle     History  Substance Use Topics  . Smoking status: Heavy Tobacco Smoker -- 0.50 packs/day for 15 years    Types: Cigarettes  . Smokeless tobacco: Never Used  . Alcohol Use: Yes     Comment: social drinker not while pregnant    Allergies: No Known Allergies  Prescriptions prior to admission  Medication Sig Dispense Refill Last Dose  . diphenhydrAMINE (BENADRYL) 25 MG tablet Take 25 mg by mouth at bedtime as needed for sleep.   Past Week at Unknown time  . Multiple Vitamin (MULTIVITAMIN WITH MINERALS) TABS tablet Take 1 tablet by mouth daily.   Past Week at Unknown time  . ferrous sulfate 325 (65 FE) MG tablet Take 1 tablet (325 mg total) by mouth 2 (two) times daily with a meal. (Patient not taking: Reported on 04/12/2014) 60 tablet 0 Past Week at Unknown time  . ibuprofen (ADVIL,MOTRIN) 600 MG tablet Take 1 tablet (600 mg  total) by mouth every 6 (six) hours. (Patient not taking: Reported on 04/12/2014) 30 tablet 0 04/04/2013 at Unknown time  . oxyCODONE-acetaminophen (PERCOCET/ROXICET) 5-325 MG per tablet Take 1-2 tablets by mouth every 4 (four) hours as needed for severe pain (moderate - severe pain). (Patient not taking: Reported on 04/12/2014) 30 tablet 0 04/04/2013 at Unknown time  . oxyCODONE-acetaminophen (ROXICET) 5-325 MG per tablet Take 1-2 tablets by mouth every 4 (four) hours as needed for severe pain. (Patient not taking: Reported on 04/12/2014) 30 tablet 0   . sertraline (ZOLOFT) 25 MG tablet Take 1 tablet (25 mg total) by mouth daily. Take 1 tab (25mg ) daily for 1 week, then increase to 2 tabs (50mg ) daily from then on. (Patient not taking: Reported on 04/12/2014) 67 tablet 1   . zolpidem (AMBIEN) 5 MG tablet Take 1-2 tablets (5-10 mg total) by mouth at bedtime as needed for  sleep. (Patient not taking: Reported on 04/12/2014) 30 tablet 0     ROS See HPI above, all other systems are negative  Physical Exam   Blood pressure 107/66, pulse 80, temperature 99.1 F (37.3 C), temperature source Oral, resp. rate 16, height 5' 4.25" (1.632 m), weight 152 lb (68.947 kg), last menstrual period 03/05/2014, unknown if currently breastfeeding.  Physical Exam Ext:  WNL ABD: Soft, non tender to palpation, no rebound or guarding SVE: yellow/green discharge with foul odor  Labs: Recent Results (from the past 2160 hour(s))  Urinalysis, Routine w reflex microscopic     Status: Abnormal   Collection Time: 04/12/14 12:45 PM  Result Value Ref Range   Color, Urine YELLOW YELLOW   APPearance CLEAR CLEAR   Specific Gravity, Urine 1.025 1.005 - 1.030   pH 6.5 5.0 - 8.0   Glucose, UA NEGATIVE NEGATIVE mg/dL   Hgb urine dipstick MODERATE (A) NEGATIVE   Bilirubin Urine NEGATIVE NEGATIVE   Ketones, ur NEGATIVE NEGATIVE mg/dL   Protein, ur NEGATIVE NEGATIVE mg/dL   Urobilinogen, UA 0.2 0.0 - 1.0 mg/dL   Nitrite NEGATIVE NEGATIVE   Leukocytes, UA NEGATIVE NEGATIVE  Urine microscopic-add on     Status: Abnormal   Collection Time: 04/12/14 12:45 PM  Result Value Ref Range   Squamous Epithelial / LPF FEW (A) RARE   WBC, UA 0-2 <3 WBC/hpf   RBC / HPF 7-10 <3 RBC/hpf   Bacteria, UA RARE RARE   Urine-Other MUCOUS PRESENT   Pregnancy, urine POC     Status: Abnormal   Collection Time: 04/12/14 12:53 PM  Result Value Ref Range   Preg Test, Ur POSITIVE (A) NEGATIVE    Comment:        THE SENSITIVITY OF THIS METHODOLOGY IS >24 mIU/mL   Wet prep, genital     Status: Abnormal   Collection Time: 04/12/14  2:24 PM  Result Value Ref Range   Yeast Wet Prep HPF POC NONE SEEN NONE SEEN   Trich, Wet Prep FEW (A) NONE SEEN   Clue Cells Wet Prep HPF POC NONE SEEN NONE SEEN   WBC, Wet Prep HPF POC FEW (A) NONE SEEN    Comment: FEW BACTERIA SEEN   ED Course  Assessment: Positive  UPT Positive Trichomomiasis    Plan: -Rx for Flagyl 2g x 1 -informed Ivery Qualerich is a STD and both she and her partner must be treated. - encouraged NOT to have sexual until her partner is sufficiently treated. -Discussed need to [facility]eding Precautions -Encouraged to call  if any questions or concerns arise prior to next scheduled office visit.  -Discharged to home in stable condition   Joniece Smotherman, CNM, MSN 04/12/2014. 2:36 PM

## 2014-04-12 NOTE — MAU Note (Signed)
Patient presents with complaint of suspected BV and pelvic pain X 1 week.

## 2014-04-12 NOTE — Progress Notes (Signed)
Notified of pt arrival in MAU and complaint. Will come see pt 

## 2014-04-14 LAB — GC/CHLAMYDIA PROBE AMP
CT Probe RNA: NEGATIVE
GC Probe RNA: NEGATIVE

## 2014-05-15 NOTE — L&D Delivery Note (Addendum)
Final Labor Progress Note At 2130 pt reports an increased in rectal pressure.   Trial pushing at 2145 was ineffective, pt allowed to labor down.  FHR remained reassuring with occasional variable decelerations.  VBAC Delivery Note The pt utilized an epidural as pain management.   Artificial rupture of membranes today, at 0848, clear.  GBS was negative    Pushing with guidance began at 0115.   After 2 hour(s) and 16 minutes of pushing the head, shoulders and the body of a viable female infant "Stacy [name]" delivered spontaneously with maternal effort in the ROA position at 0331.   With decreased tone and no cry, the infant was placed on moms abd, stimulation was ineffective so the cord was immediately clamped, cut and the infant was handed to the NICU team for assessment .    Spontaneous delivery of a intact placenta with a 3 vessel cord via [place] at  0341.   Episiotomy: None   The vulva, perineum, vaginal vault, rectum and cervix were inspected and revealed a 2 degree vaginal laceration with a bilateral labial extension which was repair using a 3-0 vicryl on a CT needle.  Lidocaine was not used, the epidural was sufficient for the repair.   Postpartum pitocin as ordered.  Fundus firm, lochia minimum, bleeding under control.  EBL 201, Pt hemodynamically stable.   Sponge, laps and needle count correct and verified with the primary care nurse.  Attending MD available at all times.    Routine postpartum orders   Mother desires OCP for contraception   Infant to have out patient circumcision   Placenta to pathology: YES due to chorioamnionitis and fetal condition Cord Gases sent to lab: YES Cord blood sent to lab: YES   APGARS:  1 at 1 minute, 3 at 5 minutes, 5 at 10 minutes and 7 at 15 minutes. Weight:.     Both mom was left in stable condition, baby went to the NICU for additional support      Venus Standard, CNM, MSN 12/01/2014. 4:43 AM   Seen and agreed T now 98.6 Will d/c Unasyn  after next dose

## 2014-06-17 LAB — OB RESULTS CONSOLE ANTIBODY SCREEN: ANTIBODY SCREEN: NEGATIVE

## 2014-06-17 LAB — OB RESULTS CONSOLE HIV ANTIBODY (ROUTINE TESTING): HIV: NONREACTIVE

## 2014-06-17 LAB — OB RESULTS CONSOLE GC/CHLAMYDIA
Chlamydia: NEGATIVE
Gonorrhea: NEGATIVE

## 2014-06-17 LAB — OB RESULTS CONSOLE RUBELLA ANTIBODY, IGM: Rubella: IMMUNE

## 2014-06-17 LAB — OB RESULTS CONSOLE ABO/RH: RH Type: POSITIVE

## 2014-06-17 LAB — OB RESULTS CONSOLE RPR: RPR: NONREACTIVE

## 2014-06-17 LAB — OB RESULTS CONSOLE HEPATITIS B SURFACE ANTIGEN: Hepatitis B Surface Ag: NEGATIVE

## 2014-11-11 LAB — OB RESULTS CONSOLE GBS: STREP GROUP B AG: NEGATIVE

## 2014-11-19 ENCOUNTER — Encounter (HOSPITAL_COMMUNITY): Payer: Self-pay | Admitting: *Deleted

## 2014-11-19 ENCOUNTER — Inpatient Hospital Stay (HOSPITAL_COMMUNITY)
Admission: AD | Admit: 2014-11-19 | Discharge: 2014-11-19 | Disposition: A | Discharge status: Home / Self Care | Payer: Medicaid Other | Source: Ambulatory Visit | Attending: Obstetrics & Gynecology | Admitting: Obstetrics & Gynecology

## 2014-11-19 DIAGNOSIS — F17210 Nicotine dependence, cigarettes, uncomplicated: Secondary | ICD-10-CM | POA: Diagnosis not present

## 2014-11-19 DIAGNOSIS — O3421 Maternal care for scar from previous cesarean delivery: Secondary | ICD-10-CM | POA: Diagnosis not present

## 2014-11-19 DIAGNOSIS — O24419 Gestational diabetes mellitus in pregnancy, unspecified control: Secondary | ICD-10-CM | POA: Diagnosis present

## 2014-11-19 DIAGNOSIS — Z3A37 37 weeks gestation of pregnancy: Secondary | ICD-10-CM | POA: Insufficient documentation

## 2014-11-19 DIAGNOSIS — O99333 Smoking (tobacco) complicating pregnancy, third trimester: Secondary | ICD-10-CM | POA: Insufficient documentation

## 2014-11-19 HISTORY — DX: Premature rupture of membranes, unspecified as to length of time between rupture and onset of labor, unspecified weeks of gestation: O42.90

## 2014-11-19 HISTORY — DX: Reserved for inherently not codable concepts without codable children: IMO0001

## 2014-11-19 NOTE — MAU Provider Note (Signed)
History   34 yo G3P0110 (previous loss of twins) at 53 4/7 weeks presented from office for monitoring due to BPP of 6/8, with reassuring but NR NST at office.  Having weekly NSTs due to GDM, on glyburide.  Reports +FM, denies contractions, leaking, or bleeding.  Hx prior primary C/S at 23 4/7 weeks, twin pregnancy, due to cervical insufficiency, SROM at 23 4/7 weeks, with IUFD of twin A at 23 5/7 weeks, and delivery by LTCS of the twins that day due to chorioamnionitis.  Has been on 17P this pregnancy, declined cerclage.  Plans for induction at 39 weeks due to GDM on glyburide.  Patient Active Problem List   Diagnosis Date Noted  . PROM (premature rupture of membranes) 03/07/2013  . Cervical insufficiency w/PPROM 03/07/13. 03/06/2013  . Anemia 03/06/2013  . Twins--delivered at 23 4/7 weeks in 2014 by LTCS, IUFD Twin A, neonatal death Twin B at 8 days of life 01/08/2013  . History of PCOS 01/08/2013  . Irregular menses 01/08/2013    Chief Complaint  Patient presents with  . non-reactive tracing    HPI:  As above  OB History    Gravida Para Term Preterm AB TAB SAB Ectopic Multiple Living        Past Medical History  Diagnosis Date  . PCOS (polycystic ovarian syndrome)   . Irregular menses   . PCOS (polycystic ovarian syndrome)   . BV (bacterial vaginosis)   . Twins 01/08/2013  . PROM (premature rupture of membranes) 03/07/2013    At [redacted]w[redacted]d, 4pm on 03/07/13      Past Surgical History  Procedure Laterality Date  . Endometrial biopsy  2009  . Dilation and curettage of uterus    . Cesarean section N/A 03/16/2013    Procedure: CESAREAN SECTION for twins, baby A Fetal Demise, baby B Nonreassuring fetal heart rate, Chorioamnionitis;  Surgeon: Michael Litter, MD;  Location: [facility];  Service: Obstetrics;  Laterality: N/A;    Family History  Problem Relation Age of Onset  . Mental illness Maternal Aunt   . Diabetes Maternal Uncle     History  Substance Use  Topics  . Smoking status: Heavy Tobacco Smoker -- 0.50 packs/day for 15 years    Types: Cigarettes  . Smokeless tobacco: Never Used  . Alcohol Use: Yes     Comment: social drinker not while pregnant    Allergies: No Known Allergies  Prescriptions prior to admission  Medication Sig Dispense Refill Last Dose  . diphenhydrAMINE (BENADRYL) 25 MG tablet Take 25 mg by mouth at bedtime as needed for sleep.   Past Week at Unknown time  . ferrous sulfate 325 (65 FE) MG tablet Take 1 tablet (325 mg total) by mouth 2 (two) times daily with a meal. (Patient not taking: Reported on 04/12/2014) 60 tablet 0 Past Week at Unknown time  . ibuprofen (ADVIL,MOTRIN) 600 MG tablet Take 1 tablet (600 mg total) by mouth every 6 (six) hours. (Patient not taking: Reported on 04/12/2014) 30 tablet 0 04/04/2013 at Unknown time  . Multiple Vitamin (MULTIVITAMIN WITH MINERALS) TABS tablet Take 1 tablet by mouth daily.   Past Week at Unknown time  . oxyCODONE-acetaminophen (PERCOCET/ROXICET) 5-325 MG per tablet Take 1-2 tablets by mouth every 4 (four) hours as needed for severe pain (moderate - severe pain). (Patient not taking: Reported on 04/12/2014) 30 tablet 0 04/04/2013 at Unknown time  . oxyCODONE-acetaminophen (ROXICET) 5-325 MG per  tablet Take 1-2 tablets by mouth every 4 (four) hours as needed for severe pain. (Patient not taking: Reported on 04/12/2014) 30 tablet 0   . sertraline (ZOLOFT) 25 MG tablet Take 1 tablet (25 mg total) by mouth daily. Take 1 tab (25mg ) daily for 1 week, then increase to 2 tabs (50mg ) daily from then on. (Patient not taking: Reported on 04/12/2014) 67 tablet 1   . zolpidem (AMBIEN) 5 MG tablet Take 1-2 tablets (5-10 mg total) by mouth at bedtime as needed for sleep. (Patient not taking: Reported on 04/12/2014) 30 tablet 0     ROS:  +FM  Physical Exam   Blood pressure 122/78, pulse 87, temperature 98.7 F (37.1 C), temperature source Oral, resp. rate 18, last menstrual period  03/05/2014, unknown if currently breastfeeding.    Physical Exam  Chest clear Heart RRR without murmur Abd gravid, NT Pelvic--deferred Ext WNL  FHR Category 1 No UCs  ED Course  Assessment: IUP at 37 4/7 weeks GDM--on glyburide BPP 6/8 at office, with reassuring but NR NST at office--Reassuring NST in MAU Hx previous C/S, with plan for TOL  Plan: D/C home per consult with Dr. Sallye OberKulwa Will schedule for induction at 39 weeks--reviewed likely need for foley bulb as initial method, then pitocin. FKCs discussed. Keep scheduled appt at [facility] for 7/15, with BPP.   Nigel BridgemanLATHAM, Iolani Twilley CNM, MSN 11/19/2014 1:51 PM

## 2014-11-19 NOTE — Discharge Instructions (Signed)
Fetal Movement Counts °Performing a fetal movement count is highly recommended in high-risk pregnancies, but it is good for every pregnant woman to do. Your health care provider may ask you to start counting fetal movements at 28 weeks of the pregnancy. Fetal movements often increase: °· After eating a full meal. °· After physical activity. °· After eating or drinking something sweet or cold. °· At rest. °Pay attention to when you feel the baby is most active. This will help you notice a pattern of your baby's sleep and wake cycles and what factors contribute to an increase in fetal movement. It is important to perform a fetal movement count at the same time each day when your baby is normally most active.  °HOW TO COUNT FETAL MOVEMENTS °1. Find a quiet and comfortable area to sit or lie down on your left side. Lying on your left side provides the best blood and oxygen circulation to your baby. °2. Write down the day and time on a sheet of paper or in a journal. °3. Start counting kicks, flutters, swishes, rolls, or jabs in a 2-hour period. You should feel at least 10 movements within 2 hours. °4. If you do not feel 10 movements in 2 hours, wait 2-3 hours and count again. Look for a change in the pattern or not enough counts in 2 hours. °SEEK MEDICAL CARE IF: °· You feel less than 10 counts in 2 hours, tried twice. °· There is no movement in over an hour. °· The pattern is changing or taking longer each day to reach 10 counts in 2 hours. °· You feel the baby is not moving as he or she usually does. ° °

## 2014-11-19 NOTE — MAU Note (Signed)
Sent from office for prolonged monitoring 

## 2014-11-24 ENCOUNTER — Telehealth (HOSPITAL_COMMUNITY): Payer: Self-pay | Admitting: *Deleted

## 2014-11-24 ENCOUNTER — Encounter (HOSPITAL_COMMUNITY): Payer: Self-pay | Admitting: *Deleted

## 2014-11-24 NOTE — Telephone Encounter (Signed)
Preadmission screen  

## 2014-11-29 ENCOUNTER — Inpatient Hospital Stay (HOSPITAL_COMMUNITY)
Admission: RE | Admit: 2014-11-29 | Discharge: 2014-12-03 | DRG: 775 | Disposition: A | Discharge status: Home / Self Care | Payer: Medicaid Other | Source: Ambulatory Visit | Attending: Obstetrics & Gynecology | Admitting: Obstetrics & Gynecology

## 2014-11-29 ENCOUNTER — Encounter (HOSPITAL_COMMUNITY): Payer: Self-pay

## 2014-11-29 DIAGNOSIS — O24419 Gestational diabetes mellitus in pregnancy, unspecified control: Secondary | ICD-10-CM

## 2014-11-29 DIAGNOSIS — O99334 Smoking (tobacco) complicating childbirth: Secondary | ICD-10-CM | POA: Diagnosis present

## 2014-11-29 DIAGNOSIS — Z3A39 39 weeks gestation of pregnancy: Secondary | ICD-10-CM | POA: Diagnosis present

## 2014-11-29 DIAGNOSIS — F419 Anxiety disorder, unspecified: Secondary | ICD-10-CM | POA: Diagnosis present

## 2014-11-29 DIAGNOSIS — O3421 Maternal care for scar from previous cesarean delivery: Secondary | ICD-10-CM | POA: Diagnosis present

## 2014-11-29 DIAGNOSIS — F17210 Nicotine dependence, cigarettes, uncomplicated: Secondary | ICD-10-CM | POA: Diagnosis present

## 2014-11-29 DIAGNOSIS — O99344 Other mental disorders complicating childbirth: Secondary | ICD-10-CM | POA: Diagnosis present

## 2014-11-29 DIAGNOSIS — K5900 Constipation, unspecified: Secondary | ICD-10-CM | POA: Diagnosis present

## 2014-11-29 DIAGNOSIS — Z833 Family history of diabetes mellitus: Secondary | ICD-10-CM

## 2014-11-29 DIAGNOSIS — Z87442 Personal history of urinary calculi: Secondary | ICD-10-CM | POA: Diagnosis not present

## 2014-11-29 DIAGNOSIS — O24429 Gestational diabetes mellitus in childbirth, unspecified control: Secondary | ICD-10-CM | POA: Diagnosis present

## 2014-11-29 DIAGNOSIS — O411230 Chorioamnionitis, third trimester, not applicable or unspecified: Secondary | ICD-10-CM | POA: Diagnosis present

## 2014-11-29 LAB — GLUCOSE, CAPILLARY
GLUCOSE-CAPILLARY: 99 mg/dL (ref 65–99)
GLUCOSE-CAPILLARY: 76 mg/dL (ref 65–99)
GLUCOSE-CAPILLARY: 68 mg/dL (ref 65–99)
Glucose-Capillary: 76 mg/dL (ref 65–99)
Glucose-Capillary: 67 mg/dL (ref 65–99)
Glucose-Capillary: 115 mg/dL — ABNORMAL HIGH (ref 65–99)

## 2014-11-29 LAB — URINE MICROSCOPIC-ADD ON

## 2014-11-29 LAB — URINALYSIS, ROUTINE W REFLEX MICROSCOPIC
Bilirubin Urine: NEGATIVE
Glucose, UA: NEGATIVE mg/dL
KETONES UR: NEGATIVE mg/dL
LEUKOCYTES UA: NEGATIVE
NITRITE: NEGATIVE
PROTEIN: NEGATIVE mg/dL
Specific Gravity, Urine: 1.01 (ref 1.005–1.030)
Urobilinogen, UA: 0.2 mg/dL (ref 0.0–1.0)
pH: 5.5 (ref 5.0–8.0)

## 2014-11-29 LAB — CBC
HEMATOCRIT: 33.6 % — AB (ref 36.0–46.0)
HEMOGLOBIN: 11.3 g/dL — AB (ref 12.0–15.0)
MCH: 31.6 pg (ref 26.0–34.0)
MCHC: 33.6 g/dL (ref 30.0–36.0)
MCV: 93.9 fL (ref 78.0–100.0)
Platelets: 175 10*3/uL (ref 150–400)
RBC: 3.58 MIL/uL — AB (ref 3.87–5.11)
RDW: 14.6 % (ref 11.5–15.5)
WBC: 7.6 10*3/uL (ref 4.0–10.5)

## 2014-11-29 LAB — RPR: RPR Ser Ql: NONREACTIVE

## 2014-11-29 LAB — HIV ANTIBODY (ROUTINE TESTING W REFLEX): HIV SCREEN 4TH GENERATION: NONREACTIVE

## 2014-11-29 LAB — TYPE AND SCREEN
ABO/RH(D): B POS
Antibody Screen: NEGATIVE

## 2014-11-29 MED ORDER — FLEET ENEMA 7-19 GM/118ML RE ENEM
1.0000 | ENEMA | Freq: Once | RECTAL | Status: AC
  Administered 2014-11-29: 1 via RECTAL

## 2014-11-29 MED ORDER — OXYCODONE-ACETAMINOPHEN 5-325 MG PO TABS
1.0000 | ORAL_TABLET | ORAL | Status: DC | PRN
  Filled 2014-11-29: qty 1

## 2014-11-29 MED ORDER — FENTANYL CITRATE (PF) 100 MCG/2ML IJ SOLN
50.0000 ug | INTRAMUSCULAR | Status: DC | PRN
  Administered 2014-11-29 – 2014-11-30 (×2): 50 ug via INTRAVENOUS
  Administered 2014-11-30: 100 ug via INTRAVENOUS
  Filled 2014-11-29 (×4): qty 2

## 2014-11-29 MED ORDER — ONDANSETRON HCL 4 MG/2ML IJ SOLN
4.0000 mg | Freq: Four times a day (QID) | INTRAMUSCULAR | Status: DC | PRN
  Administered 2014-11-30: 4 mg via INTRAVENOUS
  Filled 2014-11-29: qty 2

## 2014-11-29 MED ORDER — LIDOCAINE HCL (PF) 1 % IJ SOLN
30.0000 mL | INTRAMUSCULAR | Status: DC | PRN
  Filled 2014-11-29: qty 30

## 2014-11-29 MED ORDER — OXYTOCIN 40 UNITS IN LACTATED RINGERS INFUSION - SIMPLE MED: 1.0000 m[IU]/min | INTRAVENOUS | Status: DC

## 2014-11-29 MED ORDER — FENTANYL 2.5 MCG/ML BUPIVACAINE 1/10 % EPIDURAL INFUSION (WH - ANES)
14.0000 mL/h | INTRAMUSCULAR | Status: DC | PRN
  Administered 2014-11-30 (×2): 14 mL/h via EPIDURAL
  Filled 2014-11-29 (×5): qty 125

## 2014-11-29 MED ORDER — PHENYLEPHRINE 40 MCG/ML (10ML) SYRINGE FOR IV PUSH (FOR BLOOD PRESSURE SUPPORT)
80.0000 ug | PREFILLED_SYRINGE | INTRAVENOUS | Status: DC | PRN
  Filled 2014-11-29: qty 20
  Filled 2014-11-29: qty 2
  Filled 2014-11-29 (×2): qty 20

## 2014-11-29 MED ORDER — OXYTOCIN 40 UNITS IN LACTATED RINGERS INFUSION - SIMPLE MED: 62.5000 mL/h | INTRAVENOUS | Status: DC

## 2014-11-29 MED ORDER — OXYCODONE-ACETAMINOPHEN 5-325 MG PO TABS: 2.0000 | ORAL_TABLET | ORAL | Status: DC | PRN

## 2014-11-29 MED ORDER — LACTATED RINGERS IV SOLN
500.0000 mL | INTRAVENOUS | Status: DC | PRN
  Administered 2014-11-29: 1000 mL via INTRAVENOUS

## 2014-11-29 MED ORDER — EPHEDRINE 5 MG/ML INJ
10.0000 mg | INTRAVENOUS | Status: DC | PRN
  Filled 2014-11-29: qty 2

## 2014-11-29 MED ORDER — OXYTOCIN 40 UNITS IN LACTATED RINGERS INFUSION - SIMPLE MED
1.0000 m[IU]/min | INTRAVENOUS | Status: DC
Start: 2014-11-29 — End: 2014-12-01
  Administered 2014-11-30: 2 m[IU]/min via INTRAVENOUS
  Filled 2014-11-29: qty 1000

## 2014-11-29 MED ORDER — OXYTOCIN 40 UNITS IN LACTATED RINGERS INFUSION - SIMPLE MED
1.0000 m[IU]/min | INTRAVENOUS | Status: DC
  Administered 2014-11-29: 1 m[IU]/min via INTRAVENOUS
  Filled 2014-11-29: qty 1000

## 2014-11-29 MED ORDER — DIPHENHYDRAMINE HCL 50 MG/ML IJ SOLN: 12.5000 mg | INTRAMUSCULAR | Status: DC | PRN

## 2014-11-29 MED ORDER — OXYTOCIN BOLUS FROM INFUSION
500.0000 mL | INTRAVENOUS | Status: DC
  Administered 2014-12-01: 500 mL via INTRAVENOUS

## 2014-11-29 MED ORDER — ACETAMINOPHEN 325 MG PO TABS
650.0000 mg | ORAL_TABLET | ORAL | Status: DC | PRN
  Administered 2014-11-29 – 2014-12-01 (×3): 650 mg via ORAL
  Filled 2014-11-29 (×3): qty 2

## 2014-11-29 MED ORDER — LACTATED RINGERS IV SOLN
INTRAVENOUS | Status: DC
  Administered 2014-11-29 – 2014-11-30 (×5): via INTRAVENOUS

## 2014-11-29 MED ORDER — TERBUTALINE SULFATE 1 MG/ML IJ SOLN: 0.2500 mg | Freq: Once | INTRAMUSCULAR | Status: AC | PRN

## 2014-11-29 MED ORDER — CITRIC ACID-SODIUM CITRATE 334-500 MG/5ML PO SOLN: 30.0000 mL | ORAL | Status: DC | PRN

## 2014-11-29 NOTE — Progress Notes (Addendum)
Labor Progress  Subjective: Report constipation relieved by fleets enema.  C/l right sided stabbing flank pain   Objective: BP 118/65 mmHg  Pulse 57  Temp(Src) 98.1 F (36.7 C) (Oral)  Resp 18  Ht 5\' 4"  (1.626 m)  Wt 181 lb (82.101 kg)  BMI 31.05 kg/m2  LMP 03/05/2014    Results for orders placed or performed during the hospital encounter of 11/29/14 (from the past 24 hour(s))  CBC     Status: Abnormal   Collection Time: 11/29/14 12:45 AM  Result Value Ref Range   WBC 7.6 4.0 - 10.5 K/uL   RBC 3.58 (L) 3.87 - 5.11 MIL/uL   Hemoglobin 11.3 (L) 12.0 - 15.0 g/dL   HCT 16.133.6 (L) 09.636.0 - 04.546.0 %   MCV 93.9 78.0 - 100.0 fL   MCH 31.6 26.0 - 34.0 pg   MCHC 33.6 30.0 - 36.0 g/dL   RDW 40.914.6 81.111.5 - 91.415.5 %   Platelets 175 150 - 400 K/uL  Type and screen     Status: None   Collection Time: 11/29/14 12:45 AM  Result Value Ref Range   ABO/RH(D) B POS    Antibody Screen NEG    Sample Expiration 12/02/2014   Glucose, capillary     Status: None   Collection Time: 11/29/14  1:14 AM  Result Value Ref Range   Glucose-Capillary 99 65 - 99 mg/dL  Glucose, capillary     Status: None   Collection Time: 11/29/14  6:14 AM  Result Value Ref Range   Glucose-Capillary 76 65 - 99 mg/dL  Glucose, capillary     Status: None   Collection Time: 11/29/14 10:46 AM  Result Value Ref Range   Glucose-Capillary 76 65 - 99 mg/dL      NWG:956FHT:135 , moderate variability, + accel, no decels CTX:  regular, every 3-5 minutes Uterus gravid, soft non tender SVE:  1/70/-3 Pitocin at 46mUn/min  Assessment:  IUP at 39.0 weeks NICHD: Category 1 Membranes:  intact Labor progress: IOL Pitocin Augmentation GBS:  Negative GDM   Plan: Continue labor plan Continuous monitoring Rest/Ambulate Frequent position changes to facilitate fetal rotation and descent. Will reassess with cervical exam at 1500 or earlier if necessary Hold pitocin at 6mu UA CBG q4      Jaxson Keener, CNM, MSN 11/29/2014. 11:54  AM

## 2014-11-29 NOTE — Progress Notes (Signed)
Smitty PluckBylon L Meth MRN: [id number]  Subjective: -Patient asleep in bed.  Provider did not attempt to awaken.  Will return in a hour to reassess.  [id number]:  Patient resting in bed.  Reports pelvic floor pain and perception of contractions. Patient requests to ambulate halls.   Objective: BP 114/73 mmHg  Pulse 74  Temp(Src) 97.6 F (36.4 C) (Axillary)  Resp 18  Ht 5\' 4"  (1.626 m)  Wt 82.101 kg (181 lb)  BMI 31.05 kg/m2  LMP 03/05/2014     FHT: 125 bpm, Mod Var, -Decels, +Accels UC: Q1-394min, palpates mild   SVE:   Dilation: Fingertip Effacement (%): 70 Station: -3 Exam by:: Gerrit HeckJessica Spencer Cardinal CNM Membranes:Intact Pitocin:234mUn/min  Assessment:  IUP at 39wks Cat I FT  Pitocin IOL TOLAC GDM-A2  Plan: -Continue with pitocin infusion, leave at 824mUn/min -Discussed possibility of foley bulb in future -Order to ambulate in halls, as tolerated, with fetal monitoring -Continue other mgmt as ordered -Dr. Katharine LookJ. Ozan updated on patient status  Jourdain Guay LYNN,MSN, CNM 11/29/2014, 5:35 AM

## 2014-11-29 NOTE — Progress Notes (Signed)
Labor Progress  Subjective: Feels better after her nap.  Request to ambulate.     Objective: BP 151/70 mmHg  Pulse 58  Temp(Src) 97.5 F (36.4 C) (Oral)  Resp 20  Ht 5\' 4"  (1.626 m)  Wt 181 lb (82.101 kg)  BMI 31.05 kg/m2  LMP 03/05/2014     FHT: 135, moderate variability, + accel, no decels CTX:  regular, every 3-4 minutes Uterus gravid, soft non tender SVE:  Dilation: 1 Effacement (%): 70 Station: -3 Exam by:: v Jhade Berko, cnm Pitocin at 556mUn/min  Assessment:  IUP at 39.3 weeks NICHD: Category 1 Membranes:  intact Labor progress: IOL Pitocin Augmentation ZOX:[id number]:negative TOLAC   Plan: Continue labor plan Continuous monitoring Ambulate Frequent position changes to facilitate fetal rotation and descent. Continue pitocin per protocol 1x1      Emmaline Wahba, CNM, MSN 11/29/2014. 5:02 PM

## 2014-11-29 NOTE — Progress Notes (Signed)
Smitty PluckBylon L Hirata MRN: [id number]  Subjective: -Care assumed of 34y.o. G3P0 at 39wks who presents for IOL secondary to GDM-A2.  Patient resting in bed. Reports flank pain has subsided.  Patient perceptive of contractions and declines pain medication at current.    Objective: BP 121/67 mmHg  Pulse 61  Temp(Src) 98.4 F (36.9 C) (Oral)  Resp 18  Ht 5\' 4"  (1.626 m)  Wt 82.101 kg (181 lb)  BMI 31.05 kg/m2  LMP 03/05/2014     FHT: 135 bpm, Mod Var, -Decels, +Accels UC: Q1-492min   SVE:   Dilation: 1 Effacement (%): 70 Station: -2 Exam by:: J.Safira Proffit, CNM  Foley Bulb placed; Bloody show noted after placement Membranes:Intact Pitocin:6712mUn/min  Assessment:  IUP at 39wks Cat I FT  IOL Pitocin Foley Bulb GDM-A2 TOLAC  Plan: -Foley bulb placed, patient tolerated well -Discussed POC including: 1.Allowing for expulsion of foley bulb or removal at 0630.  2.AROM after foley bulb displacement 3. Pitocin to remain at 5012mUn/min -Questions and concerns addresed -Patient request tylenol for cramping, give as ordered -Continue other mgmt as ordered -Will update Dr. Katharine LookJ. Ozan accordingly  Shelly CossEMLY, Mailyn Steichen [facility],MSN, CNM 11/29/2014, 8:20 PM

## 2014-11-29 NOTE — Progress Notes (Addendum)
Labor Progress  Subjective: No new complaints.  Report she did not use the enema from last night and still has not had a BM.  Wants to shower.  Discussed the need for FM with pitocin.    Objective: BP 126/72 mmHg  Pulse 62  Temp(Src) 98.1 F (36.7 C) (Oral)  Resp 20  Ht 5\' 4"  (1.626 m)  Wt 181 lb (82.101 kg)  BMI 31.05 kg/m2  LMP 03/05/2014     FHT: 135, moderate variability, occasional accel, no decels CTX:  irregular, every 3-7 minutes Uterus gravid, soft non tender SVE:  Dilation: Fingertip Effacement (%): 70 Station: -3 Exam by:: Venous  Stacy [name] CNM Pitocin at 44mUn/min   Assessment:  IUP at 39.0 weeks NICHD: Category Membranes: intact Labor progress: IOL Pitocin Augmentation GBS: negative TOLAC GDM A2    Plan: Continue labor plan Continuous monitoring Ambulate in the hall with wireless monitor Frequent position changes to facilitate fetal rotation and descent. Will reassess with cervical exam at 1200 or earlier if necessary Continue pitocin hold at 6mu Pt allowed to take a bed bath enema per pt request   Stacy [name], CNM, MSN 11/29/2014. 9:18 AM

## 2014-11-29 NOTE — Progress Notes (Signed)
Pt sleeping thru ctx, did not disturb at this time

## 2014-11-29 NOTE — Progress Notes (Signed)
Addendum Results for orders placed or performed during the hospital encounter of 11/29/14 (from the past 24 hour(s))  CBC     Status: Abnormal   Collection Time: 11/29/14 12:45 AM  Result Value Ref Range   WBC 7.6 4.0 - 10.5 K/uL   RBC 3.58 (L) 3.87 - 5.11 MIL/uL   Hemoglobin 11.3 (L) 12.0 - 15.0 g/dL   HCT 81.133.6 (L) 91.436.0 - 78.246.0 %   MCV 93.9 78.0 - 100.0 fL   MCH 31.6 26.0 - 34.0 pg   MCHC 33.6 30.0 - 36.0 g/dL   RDW 95.614.6 21.311.5 - 08.615.5 %   Platelets 175 150 - 400 K/uL  Type and screen     Status: None   Collection Time: 11/29/14 12:45 AM  Result Value Ref Range   ABO/RH(D) B POS    Antibody Screen NEG    Sample Expiration 12/02/2014   HIV antibody     Status: None   Collection Time: 11/29/14 12:45 AM  Result Value Ref Range   HIV Screen 4th Generation wRfx Non Reactive Non Reactive  Glucose, capillary     Status: None   Collection Time: 11/29/14  1:14 AM  Result Value Ref Range   Glucose-Capillary 99 65 - 99 mg/dL  Glucose, capillary     Status: None   Collection Time: 11/29/14  6:14 AM  Result Value Ref Range   Glucose-Capillary 76 65 - 99 mg/dL  Glucose, capillary     Status: None   Collection Time: 11/29/14 10:46 AM  Result Value Ref Range   Glucose-Capillary 76 65 - 99 mg/dL  Urinalysis, Routine w reflex microscopic (not [facility])     Status: Abnormal   Collection Time: 11/29/14 11:48 AM  Result Value Ref Range   Color, Urine YELLOW YELLOW   APPearance CLEAR CLEAR   Specific Gravity, Urine 1.010 1.005 - 1.030   pH 5.5 5.0 - 8.0   Glucose, UA NEGATIVE NEGATIVE mg/dL   Hgb urine dipstick TRACE (A) NEGATIVE   Bilirubin Urine NEGATIVE NEGATIVE   Ketones, ur NEGATIVE NEGATIVE mg/dL   Protein, ur NEGATIVE NEGATIVE mg/dL   Urobilinogen, UA 0.2 0.0 - 1.0 mg/dL   Nitrite NEGATIVE NEGATIVE   Leukocytes, UA NEGATIVE NEGATIVE  Urine microscopic-add on     Status: None   Collection Time: 11/29/14 11:48 AM  Result Value Ref Range   Squamous Epithelial / LPF RARE RARE   RBC /  HPF 0-2 <3 RBC/hpf  Glucose, capillary     Status: None   Collection Time: 11/29/14  2:49 PM  Result Value Ref Range   Glucose-Capillary 67 65 - 99 mg/dL    Dr Charlotta Newtonzan consulted on flank pain  Plan Give reassurance offer an early epidural, pt declined
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
## 2014-11-30 ENCOUNTER — Encounter (HOSPITAL_COMMUNITY): Payer: Self-pay

## 2014-11-30 ENCOUNTER — Inpatient Hospital Stay (HOSPITAL_COMMUNITY): Payer: Medicaid Other | Admitting: Anesthesiology

## 2014-11-30 LAB — GLUCOSE, CAPILLARY
Glucose-Capillary: 91 mg/dL (ref 65–99)
Glucose-Capillary: 92 mg/dL (ref 65–99)
Glucose-Capillary: 66 mg/dL (ref 65–99)
Glucose-Capillary: 62 mg/dL — ABNORMAL LOW (ref 65–99)
Glucose-Capillary: 70 mg/dL (ref 65–99)
Glucose-Capillary: 95 mg/dL (ref 65–99)

## 2014-11-30 MED ORDER — PHENYLEPHRINE 40 MCG/ML (10ML) SYRINGE FOR IV PUSH (FOR BLOOD PRESSURE SUPPORT)
80.0000 ug | PREFILLED_SYRINGE | INTRAVENOUS | Status: DC | PRN
Start: 2014-11-30 — End: 2014-12-01
  Filled 2014-11-30: qty 2

## 2014-11-30 MED ORDER — LIDOCAINE HCL (PF) 1 % IJ SOLN
INTRAMUSCULAR | Status: DC | PRN
  Administered 2014-11-30 (×2): 4 mL via EPIDURAL

## 2014-11-30 MED ORDER — DEXTROSE IN LACTATED RINGERS 5 % IV SOLN
INTRAVENOUS | Status: DC
Start: 2014-11-30 — End: 2014-12-01
  Administered 2014-11-30: 18:00:00 via INTRAVENOUS

## 2014-11-30 MED ORDER — FENTANYL 2.5 MCG/ML BUPIVACAINE 1/10 % EPIDURAL INFUSION (WH - ANES)
14.0000 mL/h | INTRAMUSCULAR | Status: DC | PRN
  Administered 2014-12-01: 14 mL/h via EPIDURAL

## 2014-11-30 MED ORDER — SODIUM CHLORIDE 0.9 % IV SOLN
3.0000 g | Freq: Four times a day (QID) | INTRAVENOUS | Status: AC
  Administered 2014-11-30 – 2014-12-01 (×4): 3 g via INTRAVENOUS
  Filled 2014-11-30 (×4): qty 3

## 2014-11-30 MED ORDER — ACETAMINOPHEN 500 MG PO TABS
1000.0000 mg | ORAL_TABLET | Freq: Once | ORAL | Status: AC
  Administered 2014-11-30: 1000 mg via ORAL
  Filled 2014-11-30: qty 2

## 2014-11-30 MED ORDER — DIPHENHYDRAMINE HCL 50 MG/ML IJ SOLN: 12.5000 mg | INTRAMUSCULAR | Status: DC | PRN

## 2014-11-30 NOTE — Progress Notes (Signed)
Smitty PluckBylon L Klinedinst MRN: [id number]  Subjective: -Patient resting in bed.  Reports back pain and requests medication.  States she is aware of contractions, but back pain more uncomfortable.    Objective: BP 102/40 mmHg  Pulse 57  Temp(Src) 98 F (36.7 C) (Oral)  Resp 18  Ht 5\' 4"  (1.626 m)  Wt 82.101 kg (181 lb)  BMI 31.05 kg/m2  LMP 03/05/2014     FHT: 125 bpm, Mod Var, + Occasional Variable Decels, +Accels UC: Q1-663min, palpates moderate   SVE: Deferred Foley Bulb remains in place Membranes:Intact Pitocin:9318mUn/min  Assessment:  IUP at 39.1wks Cat I FT  Pitocin IOL Foley Bulb TOLAC  Plan: -Foley bulb remains in place -Give pain medication as ordered -Continue other mgmt as ordered -Will return at 0630 to remove foley bulb and SROM  Kaicen Desena LYNN,MSN, CNM 11/30/2014, 3:56 AM

## 2014-11-30 NOTE — Progress Notes (Signed)
Stacy [name] MRN: [id number]  Subjective: -Patient in bed.  Reports ongoing back pain despite fentanyl dosage.  Tearful and speaks of wanting C/S to decrease current anxiety and avoid stress to infant.   Objective: BP 128/67 mmHg  Pulse 51  Temp(Src) 98.5 F (36.9 C) (Oral)  Resp 18  Ht 5\' 4"  (1.626 m)  Wt 82.101 kg (181 lb)  BMI 31.05 kg/m2  LMP 03/05/2014     FHT: 130 bpm, Mod Var, -Decels, -Accels UC: Q1-322min, palpates mild to moderate   SVE:   Dilation: 1.5 Effacement (%): 70 Station: -3 Exam by:: Sabas SousJ. Fidel Caggiano, CNM Membranes:Intact Foley Bulb removed Pitocin:3518mUn/min  Assessment:  IUP at 39.1wks Cat I FT  TOLAC Pitocin IOL Anxiety  Plan: -Discussed desire for repeat c/s r/t anxiety, perception of stress on infant, and fear of serial induction process -Patient encouraged to get epidural to assist in pain relief then reassess desire for c/s -Patient verbalized understanding -Continue other mgmt as ordered -Dr. Kathie RhodesS. Rivard to be updated on patient progress  Kalman ShanMLY, Dae Antonucci LYNN,MSN, CNM 11/30/2014, 6:58 AM

## 2014-11-30 NOTE — Progress Notes (Signed)
Talked with pt at length about plan of care.  Pt hungry, tired, and frustrated about lack of labor progression.  Still planning a vaginal delivery but want "an hour of rest".  Had been receiving 50mcg of fentanyl with little relief so I suggested she try 100mcg to which she agreed.  I notified Dr. Estanislado Pandyivard that pt had unhooked her iv, d/cing her pitocin.  Also told her about pt frustration.  Will speak with pt about plan of care.  Left pt comfortable.

## 2014-11-30 NOTE — Anesthesia Preprocedure Evaluation (Addendum)
Anesthesia Evaluation  Patient identified by MRN, date of birth, ID band Patient awake    Reviewed: Allergy & Precautions, H&P , Patient's Chart, lab work & pertinent test results  Airway Mallampati: III  TM Distance: >3 FB Neck ROM: full    Dental no notable dental hx. (+) Teeth Intact   Pulmonary neg pulmonary ROS, Current Smoker,  breath sounds clear to auscultation  Pulmonary exam normal       Cardiovascular negative cardio ROS Normal cardiovascular examRhythm:Regular Rate:Normal     Neuro/Psych  Headaches, Anxiety    GI/Hepatic negative GI ROS, Neg liver ROS,   Endo/Other  diabetes, Well Controlled, Gestational  Renal/GU Renal disease  negative genitourinary   Musculoskeletal negative musculoskeletal ROS (+)   Abdominal (+) + obese,   Peds  Hematology  (+) anemia ,   Anesthesia Other Findings   Reproductive/Obstetrics (+) Pregnancy Previous C/Section                            Anesthesia Physical Anesthesia Plan  ASA: II  Anesthesia Plan: Epidural   Post-op Pain Management:    Induction:   Airway Management Planned: Natural Airway  Additional Equipment:   Intra-op Plan:   Post-operative Plan:   Informed Consent: I have reviewed the patients History and Physical, chart, labs and discussed the procedure including the risks, benefits and alternatives for the proposed anesthesia with the patient or authorized representative who has indicated his/her understanding and acceptance.     Plan Discussed with: Anesthesiologist  Anesthesia Plan Comments:        Anesthesia Quick Evaluation

## 2014-11-30 NOTE — Progress Notes (Signed)
Stacy [name] is a 34 y.o. G3P0110 at 2839w1dadmitted for induction of labor due to Gestational diabetes Class A2 well-controlled with glyburide 2.5 mg although patient discontinued 48 hours ago.   Subjective:  Comfortable with  nothing since patient stopped her Pitocin to tale a shower. Contractions now irregular. Patient just received fentanyl. Reports bloody show.     Objective: BP 121/80 mmHg  Pulse 57  Temp(Src) 98.9 F (37.2 C) (Oral)  Resp 18  Ht 5\' 4"  (1.626 m)  Wt 181 lb (82.101 kg)  BMI 31.05 kg/m2  LMP 03/05/2014      FHT:  FHR: Category 1 SVE:   Dilation: 3 Effacement (%): 90 Station: -1 Exam by:: dr. Estanislado [name]   AROM with minimal clear fluid and bloody show, IUPC easily positionned  Labs: Lab Results  Component Value Date   WBC 7.6 11/29/2014   HGB 11.3* 11/29/2014   HCT 33.6* 11/29/2014   MCV 93.9 11/29/2014   PLT 175 11/29/2014    Assessment / Plan: IOL for Class A@ DB with patient initially ambivalent in regards to management.  Long discussion to review options prior to exam where patient specifically confirms that she desires vaginal delivery and epidural.  Will restart Pitocin with close monitoring Fetal Wellbeing: reassuring Anticipated MOD:  NSVD  Stacy [name] A 11/30/2014, 9:09 AM

## 2014-11-30 NOTE — Progress Notes (Signed)
Stacy [name] MRN: [id number]  Subjective: -Patient reports ongoing cramping since foley bulb placement.  Reports pain located in lower abdomen. However, patient is coping well. FOB remains at bedside, supportive.  Objective: BP 107/61 mmHg  Pulse 52  Temp(Src) 98 F (36.7 C) (Oral)  Resp 18  Ht 5\' 4"  (1.626 m)  Wt 82.101 kg (181 lb)  BMI 31.05 kg/m2  LMP 03/05/2014      Results for orders placed or performed during the hospital encounter of 11/29/14 (from the past 24 hour(s))  CBC     Status: Abnormal   Collection Time: 11/29/14 12:45 AM  Result Value Ref Range   WBC 7.6 4.0 - 10.5 K/uL   RBC 3.58 (L) 3.87 - 5.11 MIL/uL   Hemoglobin 11.3 (L) 12.0 - 15.0 g/dL   HCT 82.933.6 (L) 56.236.0 - 13.046.0 %   MCV 93.9 78.0 - 100.0 fL   MCH 31.6 26.0 - 34.0 pg   MCHC 33.6 30.0 - 36.0 g/dL   RDW 86.514.6 78.411.5 - 69.615.5 %   Platelets 175 150 - 400 K/uL  Type and screen     Status: None   Collection Time: 11/29/14 12:45 AM  Result Value Ref Range   ABO/RH(D) B POS    Antibody Screen NEG    Sample Expiration 12/02/2014   RPR     Status: None   Collection Time: 11/29/14 12:45 AM  Result Value Ref Range   RPR Ser Ql Non Reactive Non Reactive  HIV antibody     Status: None   Collection Time: 11/29/14 12:45 AM  Result Value Ref Range   HIV Screen 4th Generation wRfx Non Reactive Non Reactive  Glucose, capillary     Status: None   Collection Time: 11/29/14  1:14 AM  Result Value Ref Range   Glucose-Capillary 99 65 - 99 mg/dL  Glucose, capillary     Status: None   Collection Time: 11/29/14  6:14 AM  Result Value Ref Range   Glucose-Capillary 76 65 - 99 mg/dL  Glucose, capillary     Status: None   Collection Time: 11/29/14 10:46 AM  Result Value Ref Range   Glucose-Capillary 76 65 - 99 mg/dL  Urinalysis, Routine w reflex microscopic (not [facility])     Status: Abnormal   Collection Time: 11/29/14 11:48 AM  Result Value Ref Range   Color, Urine YELLOW YELLOW   APPearance CLEAR CLEAR   Specific  Gravity, Urine 1.010 1.005 - 1.030   pH 5.5 5.0 - 8.0   Glucose, UA NEGATIVE NEGATIVE mg/dL   Hgb urine dipstick TRACE (A) NEGATIVE   Bilirubin Urine NEGATIVE NEGATIVE   Ketones, ur NEGATIVE NEGATIVE mg/dL   Protein, ur NEGATIVE NEGATIVE mg/dL   Urobilinogen, UA 0.2 0.0 - 1.0 mg/dL   Nitrite NEGATIVE NEGATIVE   Leukocytes, UA NEGATIVE NEGATIVE  Urine microscopic-add on     Status: None   Collection Time: 11/29/14 11:48 AM  Result Value Ref Range   Squamous Epithelial / LPF RARE RARE   RBC / HPF 0-2 <3 RBC/hpf  Glucose, capillary     Status: None   Collection Time: 11/29/14  2:49 PM  Result Value Ref Range   Glucose-Capillary 67 65 - 99 mg/dL  Glucose, capillary     Status: Abnormal   Collection Time: 11/29/14  7:03 PM  Result Value Ref Range   Glucose-Capillary 115 (H) 65 - 99 mg/dL  Glucose, capillary     Status: None   Collection Time: 11/29/14 11:07  PM  Result Value Ref Range   Glucose-Capillary 68 65 - 99 mg/dL    FHT: 213 bpm, Mod Var, -Decels, -Accels UC: Difficult to trace due to patient position.  Moderate contractions palpated  SVE:  Foley Bulb in place Membranes:Intact Pitocin:53mUn/min  Assessment:  IUP at 39.1wks Cat I FT  GDM-A2 Foley Bulb TOLAC  Plan: -Patient states that pain is tolerable.  Encouraged to rest. -Nurse instructed to adjust Toco and titrate pitocin, up to , based on contraction pattern -Patient informed of plan; agrees no questions or concerns -Will return in 4 hours or upon expulsion of foley bulb -Continue other mgmt as ordered  Kipling Graser LYNN,MSN, CNM 11/30/2014, 12:01 AM

## 2014-11-30 NOTE — Progress Notes (Addendum)
Labor Progress  Subjective: no complains regarding pain.  Pt has a hx of anxiety and is expressing concern as cervical change progress.  Reassurance given to pt and her FOB.  Family as a support team at the bedside  Objective: BP 115/67 mmHg  Pulse 82  Temp(Src) 100.9 F (38.3 C) (Oral)  Resp 18  Ht 5\' 4"  (1.626 m)  Wt 181 lb (82.101 kg)  BMI 31.05 kg/m2  LMP 03/05/2014   Total I/O In: -  Out: 500 [Urine:500].  FHT: 160, moderate variability, + accel, no decels CTX:  regular, every 2-3 minutes Uterus gravid, soft non tender SVE:  Dilation: 9 Effacement (%): 100 Station: 0 Exam by:: Otis Burress cnm Pitocin at 6518mUn/min  Assessment:  IUP at 39.1 weeks NICHD: Category 1 Membranes:  AROM x 12hrs, no s/s of infection Labor progress: IOL for GDM, transition stage of labor Pitocin Augmentation GBS: negative Pt on there right side TOLAC SP fever of 102, tylenol and Unisyn.  Currently 100.9   Plan: Continue labor plan Continuous/intermittent monitoring Rest Pt moved to the left side with a peanut ball Frequent position changes to facilitate fetal rotation and descent. Will reassess with cervical exam at 2300 or earlier if necessary Continue pitocin per protocol      Adelheid Hoggard, CNM, MSN 11/30/2014. 8:29 PM

## 2014-11-30 NOTE — Anesthesia Procedure Notes (Signed)
Epidural Patient location during procedure: OB Start time: 11/30/2014 9:21 AM  Staffing Anesthesiologist: Mal AmabileFOSTER, Hurley Sobel Performed by: anesthesiologist   Preanesthetic Checklist Completed: patient identified, site marked, surgical consent, pre-op evaluation, timeout performed, IV checked, risks and benefits discussed and monitors and equipment checked  Epidural Patient position: sitting Prep: site prepped and draped and DuraPrep Patient monitoring: continuous pulse ox and blood pressure Approach: midline Location: L4-L5 Injection technique: LOR air  Needle:  Needle type: Tuohy  Needle gauge: 17 G Needle length: 9 cm and 9 Needle insertion depth: 7 cm Catheter type: closed end flexible Catheter size: 19 Gauge Catheter at skin depth: 12 cm Test dose: negative and Other  Assessment Events: blood not aspirated, injection not painful, no injection resistance, negative IV test and no paresthesia  Additional Notes Patient identified. Risks and benefits discussed including failed block, incomplete  Pain control, post dural puncture headache, nerve damage, paralysis, blood pressure Changes, nausea, vomiting, reactions to medications-both toxic and allergic and post Partum back pain. All questions were answered. Patient expressed understanding and wished to proceed. Sterile technique was used throughout procedure. Epidural site was Dressed with sterile barrier dressing. No paresthesias, signs of intravascular injection Or signs of intrathecal spread were encountered.  Patient was more comfortable after the epidural was dosed. Please see RN's note for documentation of vital signs and FHR which are stable.

## 2014-11-30 NOTE — Progress Notes (Signed)
Stacy [name] is a 34 y.o. G3P0110 at 5539w1dadmitted for induction of labor due to Gestational diabetes.  Subjective:  Comfortable with  Epidural , sleeping soundly Contractions every 2 minutes, lasting 30-40 seconds with Pitocin 8 mU/min    Objective: BP 116/67 mmHg  Pulse 63  Temp(Src) 97.9 F (36.6 C) (Oral)  Resp 18  Ht 5\' 4"  (1.626 m)  Wt 181 lb (82.101 kg)  BMI 31.05 kg/m2  LMP 03/05/2014      FHT:  Category 1 SVE:   Dilation: 4 Effacement (%): 90 Station: -1 Exam by:: hk  Labs: Lab Results  Component Value Date   WBC 7.6 11/29/2014   HGB 11.3* 11/29/2014   HCT 33.6* 11/29/2014   MCV 93.9 11/29/2014   PLT 175 11/29/2014    Assessment / Plan: Induction of labor due to gestational diabetes,  progressing well on pitocin Fetal Wellbeing: reassuring Anticipated MOD:  NSVD  Darik Massing A 11/30/2014, 12:05 PM

## 2014-12-01 ENCOUNTER — Encounter (HOSPITAL_COMMUNITY): Payer: Self-pay

## 2014-12-01 LAB — GLUCOSE, CAPILLARY
Glucose-Capillary: 102 mg/dL — ABNORMAL HIGH (ref 65–99)
Glucose-Capillary: 96 mg/dL (ref 65–99)

## 2014-12-01 MED ORDER — IBUPROFEN 600 MG PO TABS
600.0000 mg | ORAL_TABLET | Freq: Four times a day (QID) | ORAL | Status: DC
  Administered 2014-12-01 – 2014-12-03 (×9): 600 mg via ORAL
  Filled 2014-12-01 (×10): qty 1

## 2014-12-01 MED ORDER — DIBUCAINE 1 % RE OINT
1.0000 "application " | TOPICAL_OINTMENT | RECTAL | Status: DC | PRN
  Administered 2014-12-02: 1 via RECTAL
  Filled 2014-12-01: qty 28

## 2014-12-01 MED ORDER — PRENATAL MULTIVITAMIN CH
1.0000 | ORAL_TABLET | Freq: Every day | ORAL | Status: DC
  Administered 2014-12-01 – 2014-12-03 (×3): 1 via ORAL
  Filled 2014-12-01 (×3): qty 1

## 2014-12-01 MED ORDER — WITCH HAZEL-GLYCERIN EX PADS: 1.0000 "application " | MEDICATED_PAD | CUTANEOUS | Status: DC | PRN

## 2014-12-01 MED ORDER — LANOLIN HYDROUS EX OINT: TOPICAL_OINTMENT | CUTANEOUS | Status: DC | PRN

## 2014-12-01 MED ORDER — ONDANSETRON HCL 4 MG/2ML IJ SOLN: 4.0000 mg | INTRAMUSCULAR | Status: DC | PRN

## 2014-12-01 MED ORDER — ONDANSETRON HCL 4 MG PO TABS: 4.0000 mg | ORAL_TABLET | ORAL | Status: DC | PRN

## 2014-12-01 MED ORDER — ACETAMINOPHEN 325 MG PO TABS: 650.0000 mg | ORAL_TABLET | ORAL | Status: DC | PRN

## 2014-12-01 MED ORDER — ZOLPIDEM TARTRATE 5 MG PO TABS
5.0000 mg | ORAL_TABLET | Freq: Every evening | ORAL | Status: DC | PRN
  Administered 2014-12-01: 5 mg via ORAL
  Filled 2014-12-01: qty 1

## 2014-12-01 MED ORDER — SENNOSIDES-DOCUSATE SODIUM 8.6-50 MG PO TABS
2.0000 | ORAL_TABLET | ORAL | Status: DC
  Administered 2014-12-01 – 2014-12-02 (×2): 2 via ORAL
  Filled 2014-12-01 (×2): qty 2

## 2014-12-01 MED ORDER — DIPHENHYDRAMINE HCL 25 MG PO CAPS: 25.0000 mg | ORAL_CAPSULE | Freq: Four times a day (QID) | ORAL | Status: DC | PRN

## 2014-12-01 MED ORDER — SIMETHICONE 80 MG PO CHEW: 80.0000 mg | CHEWABLE_TABLET | ORAL | Status: DC | PRN

## 2014-12-01 MED ORDER — OXYCODONE-ACETAMINOPHEN 5-325 MG PO TABS
1.0000 | ORAL_TABLET | ORAL | Status: DC | PRN
  Administered 2014-12-01 – 2014-12-02 (×2): 1 via ORAL
  Filled 2014-12-01 (×2): qty 1

## 2014-12-01 MED ORDER — BENZOCAINE-MENTHOL 20-0.5 % EX AERO
1.0000 "application " | INHALATION_SPRAY | CUTANEOUS | Status: DC | PRN
  Administered 2014-12-02: 1 via TOPICAL
  Filled 2014-12-01: qty 56

## 2014-12-01 MED ORDER — OXYCODONE-ACETAMINOPHEN 5-325 MG PO TABS: 2.0000 | ORAL_TABLET | ORAL | Status: DC | PRN

## 2014-12-01 MED ORDER — TETANUS-DIPHTH-ACELL PERTUSSIS 5-2.5-18.5 LF-MCG/0.5 IM SUSP: 0.5000 mL | Freq: Once | INTRAMUSCULAR | Status: DC

## 2014-12-01 NOTE — Lactation Note (Addendum)
This note was copied from the chart of Stacy Patrina Underdown. Lactation Consultation Note  Initial visit made.  Breastfeeding consultation services and support information given to patient.  Providing Breastmilk For Your Baby In NICU booklet also left with patient.  Mom states she has done hand expression and pumped already.  Baby is 6 hours old.  She obtained a few drops.  Mom is comfortable with pumping.  She had pumped for 23 week twins she lost in 2014.  She pumped then and had an abundant milk supply.  Mom denies questions or concerns.  Encouraged to call for assist prn.  Encouraged skin to skin and latching baby when able.  Patient Name: Stacy [name] GenreBylon Brunelli ZOXWR'UToday's Date: 12/01/2014 Reason for consult: Initial assessment;NICU baby   Maternal Data    Feeding    LATCH Score/Interventions                      Lactation Tools Discussed/Used Pump Review: Setup, frequency, and cleaning;Milk Storage Initiated by:: RN Date initiated:: 11/25/14   Consult Status Consult Status: Follow-up Date: 12/02/14 Follow-up type: In-patient    Huston FoleyMOULDEN, Yuval Rubens S 12/01/2014, 10:32 AM

## 2014-12-01 NOTE — Plan of Care (Signed)
Problem: Phase II Progression Outcomes Goal: Initial bonding Outcome: Not Met (add Reason) Infant to nicu Goal: Skin to skin contact initiated Outcome: Not Met (add Reason) Infant to nicu Goal: Initiate breastfeeding within 1hr delivery Outcome: Not Met (add Reason) Infant to nicu

## 2014-12-02 LAB — CBC
HEMATOCRIT: 27.7 % — AB (ref 36.0–46.0)
HEMOGLOBIN: 9.5 g/dL — AB (ref 12.0–15.0)
MCH: 31.9 pg (ref 26.0–34.0)
MCHC: 34.3 g/dL (ref 30.0–36.0)
MCV: 93 fL (ref 78.0–100.0)
Platelets: 121 10*3/uL — ABNORMAL LOW (ref 150–400)
RBC: 2.98 MIL/uL — AB (ref 3.87–5.11)
RDW: 14.8 % (ref 11.5–15.5)
WBC: 10.5 10*3/uL (ref 4.0–10.5)

## 2014-12-02 NOTE — Progress Notes (Signed)
Stacy [name]   Subjective: Post Partum Day 1 Vaginal delivery, 2 vaginal laceration w/bilateral labial extendion Patient up ad lib, denies syncope or dizziness. Reports consuming regular diet without issues and denies N/V No issues with urination and reports bleeding is appropriate  Feeding:  Breast pumping Contraceptive plan:   OCP  Objective: Temp:  [97.8 F (36.6 C)-98 F (36.7 C)] 98 F (36.7 C) (07/19 2307) Pulse Rate:  [56] 56 (07/19 2307) Resp:  [18] 18 (07/19 2307) BP: (115-128)/(67-71) 115/67 mmHg (07/19 2307) SpO2:  [100 %] 100 % (07/19 2307)  Physical Exam:  General: alert and cooperative Ext: WNL, no significant  edema. No evidence of DVT seen on physical exam. Breast: Soft filling Lungs: CTAB Heart RRR without murmur  Abdomen:  Soft, fundus firm, lochia scant, + bowel sounds, non distended, non tender Lochia: appropriate Uterine Fundus: firm Laceration: healing well    Recent Labs  12/02/14 0521  HGB 9.5*  HCT 27.7*    Assessment S/P Vaginal Delivery-Day 1 Stable  Normal Involution Breast pumping Circumcision: out patient  Plan: Continue current care Plan for discharge tomorrow and Lactation consult Lactation support   Jeronica Stlouis, CNM, MSN 12/02/2014, 12:03 PM

## 2014-12-02 NOTE — Progress Notes (Signed)
I had been with Stacy [name] during her previous pregnancy when she had a twin loss (one at birth, and one several days later).  She expressed great appreciation for the care she received during that time from all of the staff.  She is grateful that this is a happier occasion.  She reported that her baby is doing well and she is so grateful for that.  Our visit was short because of time constraints, but she reports that she is coping well at this time.  Chaplain Dyanne Carre[address] Chela [place], Bcc Pager, [phone] 4:12 PM    12/02/14 1600  Clinical Encounter Type  Visited With Patient  Visit Type Spiritual support  Spiritual Encounters  Spiritual Needs Emotional

## 2014-12-02 NOTE — Anesthesia Postprocedure Evaluation (Signed)
Anesthesia Post Note  Patient: Stacy [name]  Procedure(s) Performed: * No procedures listed *  Anesthesia type: Epidural  Patient location: Mother/Baby  Post pain: Pain level controlled  Post assessment: Post-op Vital signs reviewed  Last Vitals:  Filed Vitals:   12/02/14 1309  BP: 121/56  Pulse: 52  Temp: 36.6 C  Resp: 16    Post vital signs: Reviewed  Level of consciousness:alert  Complications: No apparent anesthesia complications

## 2014-12-02 NOTE — Lactation Note (Signed)
This note was copied from the chart of Stacy [name]. Lactation Consultation Note  Patient Name: Stacy Cresenciano GenreBylon Bazzi [id number]'s Date: 12/02/2014 Reason for consult: Follow-up assessment NICU baby 5532 hours old, [id number] GA. Mom reports that she just came from NICU where she had put baby to breast for first time. Mom states baby was crying and rooting around, but then when she was able to get baby to breast, baby too tired to nurse. Assisted mom to massage, hand express-small dots of colostrum visible at nipple, use DEBP for 15 minutes, and then hand express after pumping. No colostrum visible after pumping. Mom intends to return to NICU and put baby to breast again. Discussed normal progression of milk coming in, benefits of putting baby to breast, and enc mom to keep offering baby the breast and pumping. Plan is for mom to pump at least 8 times a day for 15 minutes, and to pump after baby at breast. Enc mom to massage and hand express prior to and after pumping. Mom knows to take whatever EBM she obtains to baby. Mom aware of pumping rooms in NICU and mom states that she will probably have to rent a pump. Mom [facility], where patients lives, and was told that they do not really have good DEBP. Mom on her way to NICU, will discuss pump rental tomorrow/prior to D/C.   Maternal Data Has patient been taught Hand Expression?: Yes Does the patient have breastfeeding experience prior to this delivery?: Yes  Feeding    LATCH Score/Interventions                      Lactation Tools Discussed/Used     Consult Status Consult Status: Follow-up Date: 12/03/14 Follow-up type: In-patient    Geralynn OchsWILLIARD, Aemon Koeller 12/02/2014, 11:42 AM

## 2014-12-03 ENCOUNTER — Ambulatory Visit: Payer: Self-pay

## 2014-12-03 MED ORDER — NORETHINDRONE 0.35 MG PO TABS: 1.0000 | ORAL_TABLET | Freq: Every day | ORAL | Status: DC

## 2014-12-03 MED ORDER — OXYCODONE-ACETAMINOPHEN 5-325 MG PO TABS: 1.0000 | ORAL_TABLET | ORAL | Status: DC | PRN

## 2014-12-03 MED ORDER — IBUPROFEN 800 MG PO TABS: 800.0000 mg | ORAL_TABLET | Freq: Three times a day (TID) | ORAL | Status: DC | PRN

## 2014-12-03 NOTE — Lactation Note (Signed)
This note was copied from the chart of Stacy Gwynn Deakin. Lactation Consultation Note  Follow up visit made prior to mother's discharge.  She states she is pumping small amounts of colostrum.  Baby is latching well per mom and may be discharged in next 2 days. Mom states she may be able to obtain a lactina pump from her [facility] [place] of [place].  Discussed WIC loaner and 2 week rental.  Mom states she can't afford either so will contact WIC and use her manual pump.  Encouraged to call with concerns prn.  Patient Name: Stacy [name] Date: 12/03/2014     Maternal Data    Feeding Feeding Type: Formula Nipple Type: Regular Length of feed: 20 min  LATCH Score/Interventions                      Lactation Tools Discussed/Used     Consult Status      Huston Foley 12/03/2014, 1:25 PM

## 2014-12-03 NOTE — Discharge Instructions (Signed)
Postpartum Care After Vaginal Delivery  After you deliver your newborn (postpartum period), the usual stay in the hospital is 24-72 hours. If there were problems with your labor or delivery, or if you have other medical problems, you might be in the hospital longer.   While you are in the hospital, you will receive help and instructions on how to care for yourself and your newborn during the postpartum period.   While you are in the hospital:  · Be sure to tell your nurses if you have pain or discomfort, as well as where you feel the pain and what makes the pain worse.  · If you had an incision made near your vagina (episiotomy) or if you had some tearing during delivery, the nurses may put ice packs on your episiotomy or tear. The ice packs may help to reduce the pain and swelling.  · If you are breastfeeding, you may feel uncomfortable contractions of your uterus for a couple of weeks. This is normal. The contractions help your uterus get back to normal size.  · It is normal to have some bleeding after delivery.  ¨ For the first 1-3 days after delivery, the flow is red and the amount may be similar to a period.  ¨ It is common for the flow to start and stop.  ¨ In the first few days, you may pass some small clots. Let your nurses know if you begin to pass large clots or your flow increases.  ¨ Do not  flush blood clots down the toilet before having the nurse look at them.  ¨ During the next 3-10 days after delivery, your flow should become more watery and pink or brown-tinged in color.  ¨ Ten to fourteen days after delivery, your flow should be a small amount of yellowish-white discharge.  ¨ The amount of your flow will decrease over the first few weeks after delivery. Your flow may stop in 6-8 weeks. Most women have had their flow stop by 12 weeks after delivery.  · You should change your sanitary pads frequently.  · Wash your hands thoroughly with soap and water for at least 20 seconds after changing pads, using  the toilet, or before holding or feeding your newborn.  · You should feel like you need to empty your bladder within the first 6-8 hours after delivery.  · In case you become weak, lightheaded, or faint, call your nurse before you get out of bed for the first time and before you take a shower for the first time.  · Within the first few days after delivery, your breasts may begin to feel tender and full. This is called engorgement. Breast tenderness usually goes away within 48-72 hours after engorgement occurs. You may also notice milk leaking from your breasts. If you are not breastfeeding, do not stimulate your breasts. Breast stimulation can make your breasts produce more milk.  · Spending as much time as possible with your newborn is very important. During this time, you and your newborn can feel close and get to know each other. Having your newborn stay in your room (rooming in) will help to strengthen the bond with your newborn.  It will give you time to get to know your newborn and become comfortable caring for your newborn.  · Your hormones change after delivery. Sometimes the hormone changes can temporarily cause you to feel sad or tearful. These feelings should not last more than a few days. If these feelings last longer than   that, you should talk to your caregiver.  · If desired, talk to your caregiver about methods of family planning or contraception.  · Talk to your caregiver about immunizations. Your caregiver may want you to have the following immunizations before leaving the hospital:  ¨ Tetanus, diphtheria, and pertussis (Tdap) or tetanus and diphtheria (Td) immunization. It is very important that you and your family (including grandparents) or others caring for your newborn are up-to-date with the Tdap or Td immunizations. The Tdap or Td immunization can help protect your newborn from getting ill.  ¨ Rubella immunization.  ¨ Varicella (chickenpox) immunization.  ¨ Influenza immunization. You should  receive this annual immunization if you did not receive the immunization during your pregnancy.  Document Released: 02/26/2007 Document Revised: 01/24/2012 Document Reviewed: 12/27/2011  ExitCare® Patient Information ©2015 [employer]. This information is not intended to replace advice given to you by your health care provider. Make sure you discuss any questions you have with your health care provider.  Postpartum Depression and Baby Blues  The postpartum period begins right after the birth of a baby. During this time, there is often a great amount of joy and excitement. It is also a time of many changes in the life of the parents. Regardless of how many times a mother gives birth, each child brings new challenges and dynamics to the family. It is not unusual to have feelings of excitement along with confusing shifts in moods, emotions, and thoughts. All mothers are at risk of developing postpartum depression or the "baby blues." These mood changes can occur right after giving birth, or they may occur many months after giving birth. The baby blues or postpartum depression can be mild or severe. Additionally, postpartum depression can go away rather quickly, or it can be a long-term condition.   CAUSES  Raised hormone levels and the rapid drop in those levels are thought to be a main cause of postpartum depression and the baby blues. A number of hormones change during and after pregnancy. Estrogen and progesterone usually decrease right after the delivery of your baby. The levels of thyroid hormone and various cortisol steroids also rapidly drop. Other factors that play a role in these mood changes include major life events and genetics.   RISK FACTORS  If you have any of the following risks for the baby blues or postpartum depression, know what symptoms to watch out for during the postpartum period. Risk factors that may increase the likelihood of getting the baby blues or postpartum depression include:  · Having  a personal or family history of depression.    · Having depression while being pregnant.    · Having premenstrual mood issues or mood issues related to oral contraceptives.  · Having a lot of life stress.    · Having marital conflict.    · Lacking a social support network.    · Having a baby with special needs.    · Having health problems, such as diabetes.    SIGNS AND SYMPTOMS  Symptoms of baby blues include:  · Brief changes in mood, such as going from extreme happiness to sadness.  · Decreased concentration.    · Difficulty sleeping.    · Crying spells, tearfulness.    · Irritability.    · Anxiety.    Symptoms of postpartum depression typically begin within the first month after giving birth. These symptoms include:  · Difficulty sleeping or excessive sleepiness.    · Marked weight loss.    · Agitation.    · Feelings of worthlessness.    · Lack   of interest in activity or food.    Postpartum psychosis is a very serious condition and can be dangerous. Fortunately, it is rare. Displaying any of the following symptoms is cause for immediate medical attention. Symptoms of postpartum psychosis include:   · Hallucinations and delusions.    · Bizarre or disorganized behavior.    · Confusion or disorientation.    DIAGNOSIS   A diagnosis is made by an evaluation of your symptoms. There are no medical or lab tests that lead to a diagnosis, but there are various questionnaires that a health care provider may use to identify those with the baby blues, postpartum depression, or psychosis. Often, a screening tool called the Edinburgh Postnatal Depression Scale is used to diagnose depression in the postpartum period.   TREATMENT  The baby blues usually goes away on its own in 1-2 weeks. Social support is often all that is needed. You will be encouraged to get adequate sleep and rest. Occasionally, you may be given medicines to help you sleep.   Postpartum depression requires treatment because it can last several months or  longer if it is not treated. Treatment may include individual or group therapy, medicine, or both to address any social, physiological, and psychological factors that may play a role in the depression. Regular exercise, a healthy diet, rest, and social support may also be strongly recommended.   Postpartum psychosis is more serious and needs treatment right away. Hospitalization is often needed.  HOME CARE INSTRUCTIONS  · Get as much rest as you can. Nap when the baby sleeps.    · Exercise regularly. Some women find yoga and walking to be beneficial.    · Eat a balanced and nourishing diet.    · Do little things that you enjoy. Have a cup of tea, take a bubble bath, read your favorite magazine, or listen to your favorite music.  · Avoid alcohol.    · Ask for help with household chores, cooking, grocery shopping, or running errands as needed. Do not try to do everything.    · Talk to people close to you about how you are feeling. Get support from your partner, family members, friends, or other new moms.  · Try to stay positive in how you think. Think about the things you are grateful for.    · Do not spend a lot of time alone.    · Only take over-the-counter or prescription medicine as directed by your health care provider.  · Keep all your postpartum appointments.    · Let your health care provider know if you have any concerns.    SEEK MEDICAL CARE IF:  You are having a reaction to or problems with your medicine.  SEEK IMMEDIATE MEDICAL CARE IF:  · You have suicidal feelings.    · You think you may harm the baby or someone else.  MAKE SURE YOU:  · Understand these instructions.  · Will watch your condition.  · Will get help right away if you are not doing well or get worse.  Document Released: 02/03/2004 Document Revised: 05/06/2013 Document Reviewed: 02/10/2013  ExitCare® Patient Information ©2015 [employer]. This information is not intended to replace advice given to you by your health care provider. Make sure  you discuss any questions you have with your health care provider.

## 2014-12-03 NOTE — Clinical Social Work Maternal (Signed)
CLINICAL SOCIAL WORK MATERNAL/CHILD NOTE  Patient Details  Name: Stacy [name] MRN: [id number] Date of Birth: May 25, 1980  Date:  12/03/2014  Clinical Social Worker Initiating Note:  Enos Muhl E. Brigitte Pulse, [place] Date/ Time Initiated:  12/03/14/1035     Child's Name:  Stacy [name]   Legal Guardian:   (Parents: Stacy [name] and Stacy [name])   Need for Interpreter:  None   Date of Referral:        Reason for Referral:   (No referral-NICU admission)   Referral Source:      Address:  [address], [place], [place] [address]  Phone number:  [phone]   Household Members:  Significant Other   Natural Supports (not living in the home):  Extended Family, Immediate Family (MOB's family lives in [place] and she states that are supportive "as much as they can be."  FOB reports having a very supportive family, who lives in the [place] area.)   Professional Supports:  (MOB reports the couple had counseling after the loss of their twins in 2014)   Employment:     Type of Work:  (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  MOB reports not feeling like she has a good understanding of baby's need for intensive care at this time.  CSW spoke with J. Dooley/NNP who plans to meet with parents for an update today)   Risk Factors/Current Problems:  None   Cognitive State:  Alert , Able to Concentrate , Linear Thinking , Goal Oriented , Insightful    Mood/Affect:  Anxious , Interested  (MOB reports, "I'm just so nervous.")    CSW Assessment: CSW met with MOB in her third floor room/306 to introduce myself, offer support and complete assessment due to baby's admission to NICU at 39.2 weeks.  CSW is familiar with couple from meeting them in Nov. 2014 after experiencing the loss of twins at 23.4 weeks (one died at birth and other died 16 days later).  MOB states she remembers CSW from that time.  MOB reports doing well.  She does not present as anxious, but told CSW, "I'm just so nervous."  She states she feels, however, that she has done well "keeping it together," and "placing things in their own boxes."  She informed CSW that she is not sure she fully understands why baby remains in the NICU.  She states she does not want to take the staff away from the sick babies, stating that "I've had a sick baby in the NICU," but feels she has only had one contact with a provider since her son was born.  CSW explained that the staff will gladly provide an update for the couple today and that we all want them to feel well informed.  (CSW notified J. Dooley/NNP after assessment).  MOB reports that she felt "fine" with baby's admission to NICU and that she "anticipated it" because she didn't want to get her hopes up that everything would go smoothly because she knows "anything is  possible."  She does not feel uncomfortable with baby's care, but wants to make sure she "knows everything."     MOB states she and FOB are permanently living in [place] at this time.  She states she traveled to [facility] for prenatal care because she felt like her provider knows her hx here and truly cares about her.  She states her delivery was a planned induction and therefore made it possible for her to deliver here.  She adds that she feels less than comfortable with the doctors and hospitals in her area.  MOB reports that she has researched the pediatrician and feels satisfied with what she knows about the office.  MOB states she can stay in [place] after  baby's discharge if NICU recommends not traveling immediately.  She reports it is a 3 hour trip.   CSW discussed common emotions often experienced during the first couple weeks of the postpartum period as well as signs and symptoms of perinatal mood disorders.  MOB states, "my picture is next to postpartum depression if you look it up."  CSW discussed how her postpartum period was complicated by grief due to the loss of her babies.  MOB reports having had counseling and feeling like she has worked through her grief.  She reports FOB went with her to counseling.  She was engaged and attentive to information CSW provided regarding signs and symptoms to watch for and states she feels comfortable calling her doctor if she has concerns about her emotions at any time.  CSW explained ongoing support services offered by NICU CSW and gave contact information.  MOB stated appreciation for the visit and support offered.   CSW Plan/Description:  Patient/Family Education , Psychosocial Support and Ongoing Assessment of Needs    Stacy [name], [place] 12/03/2014, 10:46 AM

## 2014-12-03 NOTE — Progress Notes (Signed)
Pt discharged to home with significant other.  Condition stable.  Pt home with DEBP kit, states she is unable to afford a rental.  She plans to contact the [facility] dept in her home county, use the DEBP in the NICU and use a hand pump and hand expression at home until her baby is discharged.  Pt ambulated to car with Astrid Divine, NT.  No equipment for home ordered at discharge.

## 2014-12-03 NOTE — Discharge Summary (Signed)
Vaginal Delivery Discharge Summary  Stacy [name]  DOB:    [birth year] MRN:    [id number] CSN:    [id number]  Date of admission:                  11/29/2014  Date of discharge:                   12/03/2014  Procedures this admission:  Date of Delivery: 12/01/2014 normal spontaneous vaginal delivery by venous standard, certified nurse midwife  Newborn Data:  Live born female  Birth Weight:   APGAR: 1, 3  Home with Baby will remain in the NICU. Name: Stacy [name] Circumcision Plan: Outpatient  History of Present Illness:  Ms. Stacy [name] is a 34 y.o. female, [id number], who presents at [redacted]w[redacted]d weeks gestation. The patient has been followed at the [facility] and Gynecology division of [facility]. She was admitted induction of labor. Her pregnancy has been complicated by: Gestational diabetes (class A2) treated with glyburide 2.5 mg, prior cesarean section, history of preterm and premature rupture membranes of a twin pregnancy at [redacted] weeks gestation (neither infant survived), anxiety.  Hospital course:  The patient was admitted for induction of labor. A Foley bulb was placed in the cervix because of a prior cesarean delivery. He was started on Pitocin. Her membranes were ruptured. The patient had a temperature to 102 and this was felt to be consistent with chorioamnionitis. She was given antibodies..   Her labor was complicated. She proceeded to have a vaginal delivery of a healthy infant. Her delivery was complicated. The patient had a vaginal delivery of a female infant with Apgar scores of 1 at 1 minute, 3 at 5 minutes, 5 at 10 minutes, and 7 at 15 minutes. The infant was taken to the neonatal intensive care unit. Her postpartum course was not complicated. She was discharged to home on postpartum day 2 doing well.  Feeding:  breast  Contraception:  oral progesterone-only contraceptive  Discharge hemoglobin:  HEMOGLOBIN  Date Value Ref Range Status   12/02/2014 9.5* 12.0 - 15.0 g/dL Final   HCT  Date Value Ref Range Status  12/02/2014 27.7* 36.0 - 46.0 % Final    Discharge Physical Exam:   General: alert and no distress Lochia: appropriate Uterine Fundus: firm Incision: Not applicable  DVT Evaluation: No evidence of DVT seen on physical exam.  Intrapartum Procedures: spontaneous vaginal delivery Postpartum Procedures: none Complications-Operative and Postpartum: Second degree vaginal laceration, first-degree degree perineal laceration  Discharge Diagnoses: Term Pregnancy-delivered, Amnionitis and Gestational diabetes treated with glyburide, prior cesarean delivery, history of preterm and premature rupture membranes at [redacted] weeks gestation with a prior gestation (neither infant survive), anemia, anxiety.  Discharge Information:  Activity:           pelvic rest Diet:                routine and Low carbohydrate Medications: PNV, Ibuprofen, Percocet and Micronor Condition:      stable and improved Instructions:   Postpartum Care After Vaginal Delivery  After you deliver your newborn (postpartum period), the usual stay in the hospital is 24 72 hours. If there were problems with your labor or delivery, or if you have other medical problems, you might be in the hospital longer.  While you are in the hospital, you will receive help and instructions on how to care for yourself and your newborn during the postpartum period.  While you are in  the hospital:  Be sure to tell your nurses if you have pain or discomfort, as well as where you feel the pain and what makes the pain worse.  If you had an incision made near your vagina (episiotomy) or if you had some tearing during delivery, the nurses may put ice packs on your episiotomy or tear. The ice packs may help to reduce the pain and swelling.  If you are breastfeeding, you may feel uncomfortable contractions of your uterus for a couple of weeks. This is normal. The contractions help  your uterus get back to normal size.  It is normal to have some bleeding after delivery.  For the first 1 3 days after delivery, the flow is red and the amount may be similar to a period.  It is common for the flow to start and stop.  In the first few days, you may pass some small clots. Let your nurses know if you begin to pass large clots or your flow increases.  Do not  flush blood clots down the toilet before having the nurse look at them.  During the next 3 10 days after delivery, your flow should become more watery and pink or brown-tinged in color.  Ten to fourteen days after delivery, your flow should be a small amount of yellowish-white discharge.  The amount of your flow will decrease over the first few weeks after delivery. Your flow may stop in 6 8 weeks. Most women have had their flow stop by 12 weeks after delivery.  You should change your sanitary pads frequently.  Wash your hands thoroughly with soap and water for at least 20 seconds after changing pads, using the toilet, or before holding or feeding your newborn.  You should feel like you need to empty your bladder within the first 6 8 hours after delivery.  In case you become weak, lightheaded, or faint, call your nurse before you get out of bed for the first time and before you take a shower for the first time.  Within the first few days after delivery, your breasts may begin to feel tender and full. This is called engorgement. Breast tenderness usually goes away within 48 72 hours after engorgement occurs. You may also notice milk leaking from your breasts. If you are not breastfeeding, do not stimulate your breasts. Breast stimulation can make your breasts produce more milk.  Spending as much time as possible with your newborn is very important. During this time, you and your newborn can feel close and get to know each other. Having your newborn stay in your room (rooming in) will help to strengthen the bond with your  newborn. It will give you time to get to know your newborn and become comfortable caring for your newborn.  Your hormones change after delivery. Sometimes the hormone changes can temporarily cause you to feel sad or tearful. These feelings should not last more than a few days. If these feelings last longer than that, you should talk to your caregiver.  If desired, talk to your caregiver about methods of family planning or contraception.  Talk to your caregiver about immunizations. Your caregiver may want you to have the following immunizations before leaving the hospital:  Tetanus, diphtheria, and pertussis (Tdap) or tetanus and diphtheria (Td) immunization. It is very important that you and your family (including grandparents) or others caring for your newborn are up-to-date with the Tdap or Td immunizations. The Tdap or Td immunization can help protect your newborn from  getting ill.  Rubella immunization.  Varicella (chickenpox) immunization.  Influenza immunization. You should receive this annual immunization if you did not receive the immunization during your pregnancy. Document Released: 02/26/2007 Document Revised: 01/24/2012 Document Reviewed: 12/27/2011 [facility] Patient Information 2014 [place], [place].   Postpartum Depression and Baby Blues  The postpartum period begins right after the birth of a baby. During this time, there is often a great amount of joy and excitement. It is also a time of considerable changes in the life of the parent(s). Regardless of how many times a mother gives birth, each child brings new challenges and dynamics to the family. It is not unusual to have feelings of excitement accompanied by confusing shifts in moods, emotions, and thoughts. All mothers are at risk of developing postpartum depression or the "baby blues." These mood changes can occur right after giving birth, or they may occur many months after giving birth. The baby blues or postpartum  depression can be mild or severe. Additionally, postpartum depression can resolve rather quickly, or it can be a long-term condition. CAUSES Elevated hormones and their rapid decline are thought to be a main cause of postpartum depression and the baby blues. There are a number of hormones that radically change during and after pregnancy. Estrogen and progesterone usually decrease immediately after delivering your baby. The level of thyroid hormone and various cortisol steroids also rapidly drop. Other factors that play a major role in these changes include major life events and genetics.  RISK FACTORS If you have any of the following risks for the baby blues or postpartum depression, know what symptoms to watch out for during the postpartum period. Risk factors that may increase the likelihood of getting the baby blues or postpartum depression include: 1. Havinga personal or family history of depression. 2. Having depression while being pregnant. 3. Having premenstrual or oral contraceptive-associated mood issues. 4. Having exceptional life stress. 5. Having marital conflict. 6. Lacking a social support network. 7. Having a baby with special needs. 8. Having health problems such as diabetes. SYMPTOMS Baby blues symptoms include:  Brief fluctuations in mood, such as going from extreme happiness to sadness.  Decreased concentration.  Difficulty sleeping.  Crying spells, tearfulness.  Irritability.  Anxiety. Postpartum depression symptoms typically begin within the first month after giving birth. These symptoms include:  Difficulty sleeping or excessive sleepiness.  Marked weight loss.  Agitation.  Feelings of worthlessness.  Lack of interest in activity or food. Postpartum psychosis is a very concerning condition and can be dangerous. Fortunately, it is rare. Displaying any of the following symptoms is cause for immediate medical attention. Postpartum psychosis symptoms  include:  Hallucinations and delusions.  Bizarre or disorganized behavior.  Confusion or disorientation. DIAGNOSIS  A diagnosis is made by an evaluation of your symptoms. There are no medical or lab tests that lead to a diagnosis, but there are various questionnaires that a caregiver may use to identify those with the baby blues, postpartum depression, or psychosis. Often times, a screening tool called the [place] Postnatal Depression Scale is used to diagnose depression in the postpartum period.  TREATMENT The baby blues usually goes away on its own in 1 to 2 weeks. Social support is often all that is needed. You should be encouraged to get adequate sleep and rest. Occasionally, you may be given medicines to help you sleep.  Postpartum depression requires treatment as it can last several months or longer if it is not treated. Treatment may include individual or group  therapy, medicine, or both to address any social, physiological, and psychological factors that may play a role in the depression. Regular exercise, a healthy diet, rest, and social support may also be strongly recommended.  Postpartum psychosis is more serious and needs treatment right away. Hospitalization is often needed. HOME CARE INSTRUCTIONS  Get as much rest as you can. Nap when the baby sleeps.  Exercise regularly. Some women find yoga and walking to be beneficial.  Eat a balanced and nourishing diet.  Do little things that you enjoy. Have a cup of tea, take a bubble bath, read your favorite magazine, or listen to your favorite music.  Avoid alcohol.  Ask for help with household chores, cooking, grocery shopping, or running errands as needed. Do not try to do everything.  Talk to people close to you about how you are feeling. Get support from your partner, family members, friends, or other new moms.  Try to stay positive in how you think. Think about the things you are grateful for.  Do not spend a lot of time  alone.  Only take medicine as directed by your caregiver.  Keep all your postpartum appointments.  Let your caregiver know if you have any concerns. SEEK MEDICAL CARE IF: You are having a reaction or problems with your medicine. SEEK IMMEDIATE MEDICAL CARE IF:  You have suicidal feelings.  You feel you may harm the baby or someone else. Document Released: 02/03/2004 Document Revised: 07/24/2011 Document Reviewed: 03/07/2011 [facility] Patient Information 2014 [place], [place].   Discharge to: home  Follow-up Information    Follow up with [facility] In 6 weeks.   Contact information:   [address] [place] [address] [place] [address] [phone]       Janine Limbo 12/03/2014

## 2017-10-25 DIAGNOSIS — F17200 Nicotine dependence, unspecified, uncomplicated: Secondary | ICD-10-CM | POA: Diagnosis present

## 2017-10-25 DIAGNOSIS — E282 Polycystic ovarian syndrome: Secondary | ICD-10-CM | POA: Insufficient documentation

## 2017-10-25 LAB — OB RESULTS CONSOLE GC/CHLAMYDIA
Chlamydia: NEGATIVE
Gonorrhea: NEGATIVE

## 2017-10-25 LAB — OB RESULTS CONSOLE HEPATITIS B SURFACE ANTIGEN: HEP B S AG: NEGATIVE

## 2017-10-25 LAB — OB RESULTS CONSOLE ABO/RH: RH Type: POSITIVE

## 2017-10-25 LAB — OB RESULTS CONSOLE RUBELLA ANTIBODY, IGM: Rubella: IMMUNE

## 2017-10-25 LAB — OB RESULTS CONSOLE HIV ANTIBODY (ROUTINE TESTING): HIV: NONREACTIVE

## 2017-10-25 LAB — OB RESULTS CONSOLE ANTIBODY SCREEN: Antibody Screen: NEGATIVE

## 2017-10-25 LAB — OB RESULTS CONSOLE RPR: RPR: NONREACTIVE

## 2017-10-31 ENCOUNTER — Other Ambulatory Visit (HOSPITAL_COMMUNITY): Payer: Self-pay | Admitting: Obstetrics and Gynecology

## 2017-10-31 DIAGNOSIS — O09291 Supervision of pregnancy with other poor reproductive or obstetric history, first trimester: Secondary | ICD-10-CM

## 2017-10-31 DIAGNOSIS — Z3A11 11 weeks gestation of pregnancy: Secondary | ICD-10-CM

## 2017-11-01 DIAGNOSIS — Z3490 Encounter for supervision of normal pregnancy, unspecified, unspecified trimester: Secondary | ICD-10-CM

## 2017-11-22 ENCOUNTER — Encounter (HOSPITAL_COMMUNITY): Payer: Self-pay | Admitting: *Deleted

## 2017-11-26 ENCOUNTER — Ambulatory Visit (HOSPITAL_COMMUNITY)
Admission: RE | Admit: 2017-11-26 | Discharge: 2017-11-26 | Disposition: A | Discharge status: Home / Self Care | Payer: BLUE CROSS/BLUE SHIELD | Source: Ambulatory Visit | Attending: Obstetrics and Gynecology | Admitting: Obstetrics and Gynecology

## 2017-11-26 ENCOUNTER — Other Ambulatory Visit (HOSPITAL_COMMUNITY): Payer: Self-pay | Admitting: Obstetrics and Gynecology

## 2017-11-26 ENCOUNTER — Encounter (HOSPITAL_COMMUNITY): Payer: Self-pay

## 2017-11-26 DIAGNOSIS — Z3A11 11 weeks gestation of pregnancy: Secondary | ICD-10-CM

## 2017-11-26 DIAGNOSIS — O09291 Supervision of pregnancy with other poor reproductive or obstetric history, first trimester: Secondary | ICD-10-CM | POA: Diagnosis present

## 2017-11-26 DIAGNOSIS — O09521 Supervision of elderly multigravida, first trimester: Secondary | ICD-10-CM | POA: Diagnosis not present

## 2017-11-26 DIAGNOSIS — Z3A10 10 weeks gestation of pregnancy: Secondary | ICD-10-CM | POA: Insufficient documentation

## 2017-11-26 DIAGNOSIS — O3431 Maternal care for cervical incompetence, first trimester: Secondary | ICD-10-CM | POA: Insufficient documentation

## 2017-11-26 DIAGNOSIS — O09219 Supervision of pregnancy with history of pre-term labor, unspecified trimester: Secondary | ICD-10-CM

## 2017-11-26 DIAGNOSIS — O34219 Maternal care for unspecified type scar from previous cesarean delivery: Secondary | ICD-10-CM

## 2017-11-26 DIAGNOSIS — O99331 Smoking (tobacco) complicating pregnancy, first trimester: Secondary | ICD-10-CM | POA: Diagnosis not present

## 2017-11-26 DIAGNOSIS — Z8632 Personal history of gestational diabetes: Secondary | ICD-10-CM | POA: Insufficient documentation

## 2017-11-26 NOTE — Consult Note (Addendum)
Maternal-Fetal Medicine Requesting Provider: Debby BudNaomi Dillard, M.D.  I had the pleasure of seeing Stacy [name] at the [facility].  Patient is here for ultrasound and MFM consultation.  Obstetric history: In her first and second pregnancy she had early spontaneous miscarriages.  In her third pregnancy in 2014 she had dichorionic twin gestation and went into spontaneous labor at [redacted] weeks gestation.  She had a emergency cesarean delivery of 2 female infants.  Unfortunately both of them died in the neonatal period.  She had no fever or vaginal bleeding before admission.  In 2016 she had a term vaginal delivery (VBAC) of a female infant weighing 7 pounds 6 ounces at delivery.  She took 7217 OH progesterone prophylaxis in that pregnancy.  Her pregnancy was also complicated by gestational diabetes.  Past medical history: No history of diabetes or hypertension or sickle cell trait. Past surgical history: Cesarean delivery, endometrial biopsy. Medications: Prenatal vitamins. Allergies: No known drug allergies. Social history: Patient admits to smoking 6 cigarettes daily.  She does not use alcohol or drugs.  Her partner is an African-American who is also the father of her previous children and he is in good health.  Patient works as an Advertising account plannerinsurance agent. Family history: No history of venous thromboembolism in the family.  Mother died of hypertensive complications and patient reports father suffers from alcoholism. GYN history: No history of abnormal Pap smears or cervical surgeries.  On ultrasound is CRL measurement is consistent with her previously established dates  and good fetal heart activity seen. On transabdominal scan the cervix looks normal.  I counseled the patient on the following: History of preterm delivery: This is the single most-common cause of recurrent preterm birth. I informed her that studies have shown the beneficial effect of progesterone therapy in reducing the incidence  of preterm births in women with history of preterm birth. Women with preterm births before 34 weeks benefited more from progesterone treatment.   I explained that a follow-up of infants (up to 4 years) of mothers who received progesterone during pregnancy have not shown any adverse outcomes. However, long-term effects are not known. We will also obtain cervical length measurement during anatomy scan to screen for cervical incompetence.  Patient took progesterone in her previous pregnancy and had successful outcome.  She is very firm in her decision to take progesterone prophylaxis in this pregnancy.  I also reassured her that twin pregnancies are associated with increased incidence of preterm delivery and having a singleton gestation should lead to good pregnancy outcome.  Advanced maternal age (AMA): I informed that AMA is associated with an increase in fetal aneuploidies. I discussed available screening methods. I also discussed cell-free fetal DNA screening that analyzes fetal DNA in maternal blood for trisomies 21, 18 and 13, and, which has a greater detection rate than conventional screening tests. I informed the patient that not all chromosomal malformations are detected by this test. I informed her that only invasive tests including chorion villus sampling or amniocentesis will give a definitive result on the fetal karyotype. I informed her that pregnancy loss from the procedure is about 1 in 400. I offered to set up a consultation with our genetic counselor.  Patient is keen to meet with our genetic counselor and will be making an appointment to return in 2 weeks.  Smoking: Smoking increases the risk of maternal complications that include placental abruption and placenta previa. Fetal complications include fetal growth restriction, prematurity and stillbirth. Smoking cessation at any  gestational age is beneficial and should routinely be addressed in her prenatal visits. Nicotine-replacement  therapy (NRT) can be considered if nonpharmacological approaches have failed. Its chief benefit is that it does not contain other toxic substances present in cigarettes. Cigarettes, in addition to nicotine and Carbon monoxide, contain several other toxic substances. Transdermal patch of 21 mg/day for 2 weeks followed by14 mg/day for 1 to 2 weeks and then 7 mg/day (maintenance) can be considered in her case. Overall, the benefit of NRT outweighs cigarette smoking as cigarettes contain several other compounds including carcinogens that are harmful to her and her pregnancy.  VBAC: Patient is keen to have VBAC.  Given her successful history of VBAC she is more likely to have vaginal birth.  The risk of uterine scar dehiscence is about 1% and the patient goes into spontaneous labor.  Recommendations: -Genetic counseling in 2 weeks. -Fetal anatomy scan at 19 to 20 weeks. -Cervical length measurement that 19 and 22 weeks. -17 OH progesterone prophylaxis to be initiated at 16 weeks and continued through 36 weeks. -Nicotine replacement therapy if the patient opts for patches.  Thank you for consultation.  If you have any questions or concerns please do not hesitate to contact me at the Center [facility].  Consultation including face-to-face counseling 40 minutes.

## 2017-12-01 ENCOUNTER — Other Ambulatory Visit (HOSPITAL_COMMUNITY): Payer: Self-pay

## 2017-12-02 DIAGNOSIS — O09529 Supervision of elderly multigravida, unspecified trimester: Secondary | ICD-10-CM

## 2017-12-24 ENCOUNTER — Encounter (HOSPITAL_COMMUNITY): Payer: Self-pay

## 2017-12-27 NOTE — Addendum Note (Signed)
Encounter addended by: Noralee SpaceShankar, [place] Kiel[place], MD on: 12/27/2017 1:18 PM  Actions taken: Sign clinical note

## 2018-01-25 ENCOUNTER — Other Ambulatory Visit (HOSPITAL_COMMUNITY): Payer: Self-pay | Admitting: Obstetrics and Gynecology

## 2018-01-25 DIAGNOSIS — Z3A19 19 weeks gestation of pregnancy: Secondary | ICD-10-CM

## 2018-01-25 DIAGNOSIS — Z8632 Personal history of gestational diabetes: Secondary | ICD-10-CM

## 2018-01-25 DIAGNOSIS — O09522 Supervision of elderly multigravida, second trimester: Secondary | ICD-10-CM

## 2018-01-25 DIAGNOSIS — O09212 Supervision of pregnancy with history of pre-term labor, second trimester: Secondary | ICD-10-CM

## 2018-01-25 DIAGNOSIS — O09892 Supervision of other high risk pregnancies, second trimester: Secondary | ICD-10-CM

## 2018-01-25 DIAGNOSIS — Z363 Encounter for antenatal screening for malformations: Secondary | ICD-10-CM

## 2018-01-25 DIAGNOSIS — O34219 Maternal care for unspecified type scar from previous cesarean delivery: Secondary | ICD-10-CM

## 2018-01-28 ENCOUNTER — Other Ambulatory Visit: Payer: Self-pay

## 2018-01-28 ENCOUNTER — Ambulatory Visit (HOSPITAL_COMMUNITY)
Admission: RE | Admit: 2018-01-28 | Discharge: 2018-01-28 | Disposition: A | Discharge status: Home / Self Care | Payer: BLUE CROSS/BLUE SHIELD | Source: Ambulatory Visit | Attending: Obstetrics and Gynecology | Admitting: Obstetrics and Gynecology

## 2018-01-28 ENCOUNTER — Encounter (HOSPITAL_COMMUNITY): Payer: Self-pay

## 2018-01-28 DIAGNOSIS — O34219 Maternal care for unspecified type scar from previous cesarean delivery: Secondary | ICD-10-CM

## 2018-01-28 DIAGNOSIS — O09212 Supervision of pregnancy with history of pre-term labor, second trimester: Secondary | ICD-10-CM

## 2018-01-28 DIAGNOSIS — Z363 Encounter for antenatal screening for malformations: Secondary | ICD-10-CM | POA: Insufficient documentation

## 2018-01-28 DIAGNOSIS — Z8632 Personal history of gestational diabetes: Secondary | ICD-10-CM

## 2018-01-28 DIAGNOSIS — O09522 Supervision of elderly multigravida, second trimester: Secondary | ICD-10-CM

## 2018-01-28 DIAGNOSIS — Z3A19 19 weeks gestation of pregnancy: Secondary | ICD-10-CM | POA: Insufficient documentation

## 2018-01-28 DIAGNOSIS — O09892 Supervision of other high risk pregnancies, second trimester: Secondary | ICD-10-CM

## 2018-01-29 ENCOUNTER — Other Ambulatory Visit (HOSPITAL_COMMUNITY): Payer: Self-pay | Admitting: *Deleted

## 2018-01-29 DIAGNOSIS — O09529 Supervision of elderly multigravida, unspecified trimester: Secondary | ICD-10-CM

## 2018-02-04 ENCOUNTER — Other Ambulatory Visit (HOSPITAL_COMMUNITY): Payer: BLUE CROSS/BLUE SHIELD

## 2018-02-26 ENCOUNTER — Encounter (HOSPITAL_COMMUNITY): Payer: Self-pay

## 2018-02-28 ENCOUNTER — Other Ambulatory Visit (HOSPITAL_COMMUNITY): Payer: Self-pay | Admitting: *Deleted

## 2018-02-28 ENCOUNTER — Ambulatory Visit (HOSPITAL_COMMUNITY)
Admission: RE | Admit: 2018-02-28 | Discharge: 2018-02-28 | Disposition: A | Discharge status: Home / Self Care | Payer: BLUE CROSS/BLUE SHIELD | Source: Ambulatory Visit | Attending: Obstetrics and Gynecology | Admitting: Obstetrics and Gynecology

## 2018-02-28 ENCOUNTER — Encounter (HOSPITAL_COMMUNITY): Payer: Self-pay

## 2018-02-28 DIAGNOSIS — O09529 Supervision of elderly multigravida, unspecified trimester: Secondary | ICD-10-CM

## 2018-02-28 DIAGNOSIS — O34219 Maternal care for unspecified type scar from previous cesarean delivery: Secondary | ICD-10-CM

## 2018-02-28 DIAGNOSIS — O09522 Supervision of elderly multigravida, second trimester: Secondary | ICD-10-CM | POA: Insufficient documentation

## 2018-02-28 DIAGNOSIS — Z362 Encounter for other antenatal screening follow-up: Secondary | ICD-10-CM

## 2018-02-28 DIAGNOSIS — Z3A24 24 weeks gestation of pregnancy: Secondary | ICD-10-CM | POA: Insufficient documentation

## 2018-02-28 DIAGNOSIS — O09292 Supervision of pregnancy with other poor reproductive or obstetric history, second trimester: Secondary | ICD-10-CM

## 2018-02-28 DIAGNOSIS — O09212 Supervision of pregnancy with history of pre-term labor, second trimester: Secondary | ICD-10-CM | POA: Diagnosis not present

## 2018-02-28 HISTORY — DX: Unspecified abnormal cytological findings in specimens from vagina: R87.629

## 2018-03-28 ENCOUNTER — Ambulatory Visit (HOSPITAL_COMMUNITY)
Admission: RE | Admit: 2018-03-28 | Discharge: 2018-03-28 | Disposition: A | Discharge status: Home / Self Care | Payer: BLUE CROSS/BLUE SHIELD | Source: Ambulatory Visit | Attending: Obstetrics and Gynecology | Admitting: Obstetrics and Gynecology

## 2018-03-28 ENCOUNTER — Other Ambulatory Visit (HOSPITAL_COMMUNITY): Payer: Self-pay | Admitting: *Deleted

## 2018-03-28 ENCOUNTER — Encounter (HOSPITAL_COMMUNITY): Payer: Self-pay

## 2018-03-28 DIAGNOSIS — O09293 Supervision of pregnancy with other poor reproductive or obstetric history, third trimester: Secondary | ICD-10-CM | POA: Insufficient documentation

## 2018-03-28 DIAGNOSIS — Z3A28 28 weeks gestation of pregnancy: Secondary | ICD-10-CM | POA: Diagnosis not present

## 2018-03-28 DIAGNOSIS — O09212 Supervision of pregnancy with history of pre-term labor, second trimester: Secondary | ICD-10-CM

## 2018-03-28 DIAGNOSIS — O09523 Supervision of elderly multigravida, third trimester: Secondary | ICD-10-CM | POA: Diagnosis not present

## 2018-03-28 DIAGNOSIS — O09529 Supervision of elderly multigravida, unspecified trimester: Secondary | ICD-10-CM

## 2018-03-28 DIAGNOSIS — Z3689 Encounter for other specified antenatal screening: Secondary | ICD-10-CM

## 2018-03-28 DIAGNOSIS — O34219 Maternal care for unspecified type scar from previous cesarean delivery: Secondary | ICD-10-CM | POA: Insufficient documentation

## 2018-03-28 DIAGNOSIS — O09213 Supervision of pregnancy with history of pre-term labor, third trimester: Secondary | ICD-10-CM | POA: Diagnosis not present

## 2018-03-28 DIAGNOSIS — Z364 Encounter for antenatal screening for fetal growth retardation: Secondary | ICD-10-CM | POA: Diagnosis not present

## 2018-03-28 DIAGNOSIS — O09522 Supervision of elderly multigravida, second trimester: Secondary | ICD-10-CM

## 2018-03-28 DIAGNOSIS — O09292 Supervision of pregnancy with other poor reproductive or obstetric history, second trimester: Secondary | ICD-10-CM

## 2018-04-24 ENCOUNTER — Encounter (HOSPITAL_COMMUNITY): Payer: Self-pay

## 2018-04-25 ENCOUNTER — Encounter (HOSPITAL_COMMUNITY): Payer: Self-pay

## 2018-04-25 ENCOUNTER — Other Ambulatory Visit (HOSPITAL_COMMUNITY): Payer: Self-pay | Admitting: *Deleted

## 2018-04-25 ENCOUNTER — Ambulatory Visit (HOSPITAL_COMMUNITY)
Admission: RE | Admit: 2018-04-25 | Discharge: 2018-04-25 | Disposition: A | Discharge status: Home / Self Care | Payer: BLUE CROSS/BLUE SHIELD | Source: Ambulatory Visit | Attending: Obstetrics and Gynecology | Admitting: Obstetrics and Gynecology

## 2018-04-25 DIAGNOSIS — O09213 Supervision of pregnancy with history of pre-term labor, third trimester: Secondary | ICD-10-CM | POA: Diagnosis not present

## 2018-04-25 DIAGNOSIS — Z3689 Encounter for other specified antenatal screening: Secondary | ICD-10-CM

## 2018-04-25 DIAGNOSIS — O09523 Supervision of elderly multigravida, third trimester: Secondary | ICD-10-CM

## 2018-04-25 DIAGNOSIS — O99333 Smoking (tobacco) complicating pregnancy, third trimester: Secondary | ICD-10-CM | POA: Diagnosis not present

## 2018-04-25 DIAGNOSIS — O09529 Supervision of elderly multigravida, unspecified trimester: Secondary | ICD-10-CM

## 2018-04-25 DIAGNOSIS — Z3A32 32 weeks gestation of pregnancy: Secondary | ICD-10-CM | POA: Diagnosis not present

## 2018-04-25 DIAGNOSIS — O34219 Maternal care for unspecified type scar from previous cesarean delivery: Secondary | ICD-10-CM | POA: Insufficient documentation

## 2018-04-25 DIAGNOSIS — O09293 Supervision of pregnancy with other poor reproductive or obstetric history, third trimester: Secondary | ICD-10-CM | POA: Insufficient documentation

## 2018-05-15 NOTE — L&D Delivery Note (Signed)
Delivery Note At  a viable female was delivered via  (Presentation:OA ;  ).  APGAR:9 ,9 ; weight pending  .   Placenta status:Complete , .3V  Cord:  with the following complications: FHT down to  90s with pushing with no return to BL after 8 minutes MD called to come in house MD applied vacuum with delivery after 3 pop offs and ML episotomy .  NICU present.  Cord clamped and cut taken to warmer.  Vigorous cry noted.  Cord UJ:WJXBpH:sent Anesthesia:  Epidural and local 1% lidocaine.  Careful inspection revealed a 4th degree laceration.  Dr. Normand Sloopillard in for observation of repair. Episiotomy:  ML Lacerations:  4th degree Suture Repair: 2-0 and 4-0 vicryl for repair Est. Blood Loss (mL):  800 cc  Mom to postpartum.  Baby to Couplet care / Skin to Skin.  Stacy [name] 06/19/2018, 11:37 PM

## 2018-05-16 LAB — OB RESULTS CONSOLE GBS: GBS: POSITIVE

## 2018-05-20 DIAGNOSIS — O99820 Streptococcus B carrier state complicating pregnancy: Secondary | ICD-10-CM

## 2018-05-24 ENCOUNTER — Ambulatory Visit (HOSPITAL_COMMUNITY)
Admission: RE | Admit: 2018-05-24 | Discharge: 2018-05-24 | Disposition: A | Discharge status: Home / Self Care | Payer: BLUE CROSS/BLUE SHIELD | Source: Ambulatory Visit | Attending: Obstetrics and Gynecology | Admitting: Obstetrics and Gynecology

## 2018-05-24 DIAGNOSIS — Z3A36 36 weeks gestation of pregnancy: Secondary | ICD-10-CM

## 2018-05-24 DIAGNOSIS — Z362 Encounter for other antenatal screening follow-up: Secondary | ICD-10-CM | POA: Diagnosis not present

## 2018-05-24 DIAGNOSIS — O09523 Supervision of elderly multigravida, third trimester: Secondary | ICD-10-CM

## 2018-05-24 DIAGNOSIS — O09213 Supervision of pregnancy with history of pre-term labor, third trimester: Secondary | ICD-10-CM

## 2018-05-24 DIAGNOSIS — O34219 Maternal care for unspecified type scar from previous cesarean delivery: Secondary | ICD-10-CM

## 2018-05-24 DIAGNOSIS — O09293 Supervision of pregnancy with other poor reproductive or obstetric history, third trimester: Secondary | ICD-10-CM | POA: Diagnosis not present

## 2018-05-24 DIAGNOSIS — O09529 Supervision of elderly multigravida, unspecified trimester: Secondary | ICD-10-CM | POA: Insufficient documentation

## 2018-05-24 DIAGNOSIS — O99333 Smoking (tobacco) complicating pregnancy, third trimester: Secondary | ICD-10-CM

## 2018-06-18 ENCOUNTER — Telehealth (HOSPITAL_COMMUNITY): Payer: Self-pay | Admitting: *Deleted

## 2018-06-18 ENCOUNTER — Other Ambulatory Visit: Payer: Self-pay | Admitting: Obstetrics and Gynecology

## 2018-06-18 ENCOUNTER — Encounter (HOSPITAL_COMMUNITY): Payer: Self-pay | Admitting: *Deleted

## 2018-06-18 NOTE — Telephone Encounter (Signed)
Preadmission screen  

## 2018-06-19 ENCOUNTER — Inpatient Hospital Stay (HOSPITAL_COMMUNITY): Payer: BLUE CROSS/BLUE SHIELD | Admitting: Anesthesiology

## 2018-06-19 ENCOUNTER — Inpatient Hospital Stay (HOSPITAL_COMMUNITY)
Admission: RE | Admit: 2018-06-19 | Discharge: 2018-06-21 | DRG: 768 | Disposition: A | Discharge status: Home / Self Care | Payer: BLUE CROSS/BLUE SHIELD | Attending: Obstetrics and Gynecology | Admitting: Obstetrics and Gynecology

## 2018-06-19 ENCOUNTER — Encounter (HOSPITAL_COMMUNITY): Payer: Self-pay

## 2018-06-19 DIAGNOSIS — Z3A40 40 weeks gestation of pregnancy: Secondary | ICD-10-CM

## 2018-06-19 DIAGNOSIS — O9081 Anemia of the puerperium: Secondary | ICD-10-CM | POA: Diagnosis not present

## 2018-06-19 DIAGNOSIS — Z3490 Encounter for supervision of normal pregnancy, unspecified, unspecified trimester: Secondary | ICD-10-CM

## 2018-06-19 DIAGNOSIS — O99334 Smoking (tobacco) complicating childbirth: Secondary | ICD-10-CM | POA: Diagnosis present

## 2018-06-19 DIAGNOSIS — O99824 Streptococcus B carrier state complicating childbirth: Principal | ICD-10-CM | POA: Diagnosis present

## 2018-06-19 DIAGNOSIS — O99820 Streptococcus B carrier state complicating pregnancy: Secondary | ICD-10-CM

## 2018-06-19 DIAGNOSIS — O26893 Other specified pregnancy related conditions, third trimester: Secondary | ICD-10-CM | POA: Diagnosis present

## 2018-06-19 DIAGNOSIS — Z98891 History of uterine scar from previous surgery: Secondary | ICD-10-CM | POA: Insufficient documentation

## 2018-06-19 DIAGNOSIS — F17210 Nicotine dependence, cigarettes, uncomplicated: Secondary | ICD-10-CM | POA: Diagnosis present

## 2018-06-19 DIAGNOSIS — F419 Anxiety disorder, unspecified: Secondary | ICD-10-CM | POA: Diagnosis present

## 2018-06-19 DIAGNOSIS — O09529 Supervision of elderly multigravida, unspecified trimester: Secondary | ICD-10-CM

## 2018-06-19 DIAGNOSIS — F17200 Nicotine dependence, unspecified, uncomplicated: Secondary | ICD-10-CM | POA: Diagnosis present

## 2018-06-19 DIAGNOSIS — Z91410 Personal history of adult physical and sexual abuse: Secondary | ICD-10-CM | POA: Diagnosis present

## 2018-06-19 DIAGNOSIS — O34219 Maternal care for unspecified type scar from previous cesarean delivery: Secondary | ICD-10-CM | POA: Diagnosis present

## 2018-06-19 LAB — PROTEIN / CREATININE RATIO, URINE
Creatinine, Urine: 68 mg/dL
Protein Creatinine Ratio: 0.24 mg/mg{Cre} — ABNORMAL HIGH (ref 0.00–0.15)
Total Protein, Urine: 16 mg/dL

## 2018-06-19 LAB — COMPREHENSIVE METABOLIC PANEL
ALT: 6 U/L (ref 0–44)
AST: 17 U/L (ref 15–41)
Albumin: 3.1 g/dL — ABNORMAL LOW (ref 3.5–5.0)
Alkaline Phosphatase: 95 U/L (ref 38–126)
Anion gap: 7 (ref 5–15)
BUN: 8 mg/dL (ref 6–20)
CO2: 20 mmol/L — ABNORMAL LOW (ref 22–32)
Calcium: 8.7 mg/dL — ABNORMAL LOW (ref 8.9–10.3)
Chloride: 108 mmol/L (ref 98–111)
Creatinine, Ser: 0.35 mg/dL — ABNORMAL LOW (ref 0.44–1.00)
GFR calc Af Amer: >60 mL/min (ref >60)
GFR calc non Af Amer: >60 mL/min (ref >60)
Glucose, Bld: 89 mg/dL (ref 70–99)
Potassium: 3.7 mmol/L (ref 3.5–5.1)
Sodium: 135 mmol/L (ref 135–145)
Total Bilirubin: 0.8 mg/dL (ref 0.3–1.2)
Total Protein: 6.7 g/dL (ref 6.5–8.1)

## 2018-06-19 LAB — LACTATE DEHYDROGENASE: LDH: 135 U/L (ref 98–192)

## 2018-06-19 LAB — TYPE AND SCREEN
ABO/RH(D): B POS
Antibody Screen: NEGATIVE

## 2018-06-19 LAB — CBC
HCT: 34.6 % — ABNORMAL LOW (ref 36.0–46.0)
Hemoglobin: 11.7 g/dL — ABNORMAL LOW (ref 12.0–15.0)
MCH: 31.8 pg (ref 26.0–34.0)
MCHC: 33.8 g/dL (ref 30.0–36.0)
MCV: 94 fL (ref 80.0–100.0)
Platelets: 185 10*3/uL (ref 150–400)
RBC: 3.68 MIL/uL — ABNORMAL LOW (ref 3.87–5.11)
RDW: 14.6 % (ref 11.5–15.5)
WBC: 6.1 10*3/uL (ref 4.0–10.5)
nRBC: 0 % (ref 0.0–0.2)

## 2018-06-19 LAB — URIC ACID: URIC ACID, SERUM: 4 mg/dL (ref 2.5–7.1)

## 2018-06-19 MED ORDER — OXYCODONE-ACETAMINOPHEN 5-325 MG PO TABS: 1.0000 | ORAL_TABLET | ORAL | Status: DC | PRN

## 2018-06-19 MED ORDER — PHENYLEPHRINE 40 MCG/ML (10ML) SYRINGE FOR IV PUSH (FOR BLOOD PRESSURE SUPPORT)
80.0000 ug | PREFILLED_SYRINGE | INTRAVENOUS | Status: DC | PRN
  Filled 2018-06-19 (×2): qty 10

## 2018-06-19 MED ORDER — LIDOCAINE HCL (PF) 1 % IJ SOLN
INTRAMUSCULAR | Status: DC | PRN
  Administered 2018-06-19: 6 mL via EPIDURAL
  Administered 2018-06-19: 7 mL via EPIDURAL

## 2018-06-19 MED ORDER — ONDANSETRON HCL 4 MG/2ML IJ SOLN: 4.0000 mg | Freq: Four times a day (QID) | INTRAMUSCULAR | Status: DC | PRN

## 2018-06-19 MED ORDER — HYDROXYZINE HCL 50 MG PO TABS
50.0000 mg | ORAL_TABLET | Freq: Four times a day (QID) | ORAL | Status: DC | PRN
  Filled 2018-06-19: qty 1

## 2018-06-19 MED ORDER — EPHEDRINE 5 MG/ML INJ
10.0000 mg | INTRAVENOUS | Status: DC | PRN
  Filled 2018-06-19: qty 2

## 2018-06-19 MED ORDER — LIDOCAINE HCL (PF) 1 % IJ SOLN
30.0000 mL | INTRAMUSCULAR | Status: DC | PRN
  Administered 2018-06-19: 30 mL via SUBCUTANEOUS
  Filled 2018-06-19: qty 30

## 2018-06-19 MED ORDER — PENICILLIN G 3 MILLION UNITS IVPB - SIMPLE MED
3.0000 10*6.[IU] | INTRAVENOUS | Status: DC
  Administered 2018-06-19 (×3): 3 10*6.[IU] via INTRAVENOUS
  Filled 2018-06-19 (×6): qty 100

## 2018-06-19 MED ORDER — SODIUM CHLORIDE 0.9 % IV SOLN
5.0000 10*6.[IU] | Freq: Once | INTRAVENOUS | Status: AC
  Administered 2018-06-19: 5 10*6.[IU] via INTRAVENOUS
  Filled 2018-06-19: qty 5

## 2018-06-19 MED ORDER — OXYTOCIN 40 UNITS IN NORMAL SALINE INFUSION - SIMPLE MED: 2.5000 [IU]/h | INTRAVENOUS | Status: DC

## 2018-06-19 MED ORDER — ACETAMINOPHEN 325 MG PO TABS: 650.0000 mg | ORAL_TABLET | ORAL | Status: DC | PRN

## 2018-06-19 MED ORDER — LACTATED RINGERS IV SOLN
500.0000 mL | Freq: Once | INTRAVENOUS | Status: AC
  Administered 2018-06-19: 500 mL via INTRAVENOUS

## 2018-06-19 MED ORDER — FENTANYL 2.5 MCG/ML BUPIVACAINE 1/10 % EPIDURAL INFUSION (WH - ANES)
14.0000 mL/h | INTRAMUSCULAR | Status: DC | PRN
  Administered 2018-06-19: 14 mL/h via EPIDURAL
  Filled 2018-06-19: qty 100

## 2018-06-19 MED ORDER — LACTATED RINGERS IV SOLN
500.0000 mL | INTRAVENOUS | Status: DC | PRN
  Administered 2018-06-19: 500 mL via INTRAVENOUS

## 2018-06-19 MED ORDER — DIPHENHYDRAMINE HCL 50 MG/ML IJ SOLN: 12.5000 mg | INTRAMUSCULAR | Status: DC | PRN

## 2018-06-19 MED ORDER — FENTANYL CITRATE (PF) 100 MCG/2ML IJ SOLN
50.0000 ug | INTRAMUSCULAR | Status: DC | PRN
  Administered 2018-06-19: 100 ug via INTRAVENOUS
  Filled 2018-06-19: qty 2

## 2018-06-19 MED ORDER — PHENYLEPHRINE 40 MCG/ML (10ML) SYRINGE FOR IV PUSH (FOR BLOOD PRESSURE SUPPORT)
80.0000 ug | PREFILLED_SYRINGE | INTRAVENOUS | Status: DC | PRN
  Filled 2018-06-19: qty 10

## 2018-06-19 MED ORDER — LACTATED RINGERS IV SOLN
INTRAVENOUS | Status: DC
  Administered 2018-06-19 (×2): via INTRAVENOUS

## 2018-06-19 MED ORDER — SOD CITRATE-CITRIC ACID 500-334 MG/5ML PO SOLN: 30.0000 mL | ORAL | Status: DC | PRN

## 2018-06-19 MED ORDER — TERBUTALINE SULFATE 1 MG/ML IJ SOLN
0.2500 mg | Freq: Once | INTRAMUSCULAR | Status: DC | PRN
  Filled 2018-06-19: qty 1

## 2018-06-19 MED ORDER — OXYTOCIN 40 UNITS IN NORMAL SALINE INFUSION - SIMPLE MED: 1.0000 m[IU]/min | INTRAVENOUS | Status: DC

## 2018-06-19 MED ORDER — OXYCODONE-ACETAMINOPHEN 5-325 MG PO TABS: 2.0000 | ORAL_TABLET | ORAL | Status: DC | PRN

## 2018-06-19 MED ORDER — OXYTOCIN BOLUS FROM INFUSION
500.0000 mL | Freq: Once | INTRAVENOUS | Status: AC
  Administered 2018-06-19: 500 mL via INTRAVENOUS

## 2018-06-19 MED ORDER — OXYTOCIN 40 UNITS IN NORMAL SALINE INFUSION - SIMPLE MED
1.0000 m[IU]/min | INTRAVENOUS | Status: DC
  Administered 2018-06-19: 1 m[IU]/min via INTRAVENOUS
  Filled 2018-06-19: qty 1000

## 2018-06-19 NOTE — Progress Notes (Signed)
Pt on unit-wishes to shower prior to induction process-provider called and was ok with pt to shower and then start induction-provider to put in orders for induction

## 2018-06-19 NOTE — Progress Notes (Signed)
Stacy [name] is a 38 y.o. G3P1101 at 1563w1d admitted for induction of labor due to Elective at term, TOLAC.  Subjective: Patient reports she is feeling moderate to strong regular contractions but they are less painful now that her foley bulb has fallen out. Patient has had two elevated blood pressures with systolic >140. PIH labs have returned and are negative for preeclampsia. At this time, as patient is uncomfortable, I would not give her the diagnosis of gestational hypertension as the elevations are likely pain related. Dr. Estanislado Pandyivard made aware of the blood pressures and lab results.   Discussed AROM with patient who does not desire at this time. She would be okay with AROM closer to the time of delivery if needed or if she chooses to have an epidural for pain management. I will re-address AROM if indicated at a later point.   Objective: Vitals:   06/19/18 1401 06/19/18 1430 06/19/18 1531 06/19/18 1600  BP: 128/73 136/78 125/78 120/71  Pulse: 66 69 66 66  Resp:  16 16 20   Temp:      TempSrc:      Weight:      Height:       Results for orders placed or performed during the hospital encounter of 06/19/18 (from the past 24 hour(s))  CBC     Status: Abnormal   Collection Time: 06/19/18  8:09 AM  Result Value Ref Range   WBC 6.1 4.0 - 10.5 K/uL   RBC 3.68 (L) 3.87 - 5.11 MIL/uL   Hemoglobin 11.7 (L) 12.0 - 15.0 g/dL   HCT 16.134.6 (L) 09.636.0 - 04.546.0 %   MCV 94.0 80.0 - 100.0 fL   MCH 31.8 26.0 - 34.0 pg   MCHC 33.8 30.0 - 36.0 g/dL   RDW 40.914.6 81.111.5 - 91.415.5 %   Platelets 185 150 - 400 K/uL   nRBC 0.0 0.0 - 0.2 %  Type and screen [facility]     Status: None   Collection Time: 06/19/18  8:09 AM  Result Value Ref Range   ABO/RH(D) B POS    Antibody Screen NEG    Sample Expiration      06/22/2018 Performed at [facility], [address] [place], [place] [id number]   Comprehensive metabolic panel     Status: Abnormal   Collection Time: 06/19/18  9:12 AM  Result  Value Ref Range   Sodium 135 135 - 145 mmol/L   Potassium 3.7 3.5 - 5.1 mmol/L   Chloride 108 98 - 111 mmol/L   CO2 20 (L) 22 - 32 mmol/L   Glucose, Bld 89 70 - 99 mg/dL   BUN 8 6 - 20 mg/dL   Creatinine, Ser 6.210.35 (L) 0.44 - 1.00 mg/dL   Calcium 8.7 (L) 8.9 - 10.3 mg/dL   Total Protein 6.7 6.5 - 8.1 g/dL   Albumin 3.1 (L) 3.5 - 5.0 g/dL   AST 17 15 - 41 U/L   ALT 6 0 - 44 U/L   Alkaline Phosphatase 95 38 - 126 U/L   Total Bilirubin 0.8 0.3 - 1.2 mg/dL   GFR calc non Af Amer >60 >60 mL/min   GFR calc Af Amer >60 >60 mL/min   Anion gap 7 5 - 15  Uric acid     Status: None   Collection Time: 06/19/18  9:12 AM  Result Value Ref Range   Uric Acid, Serum 4.0 2.5 - 7.1 mg/dL  Lactate dehydrogenase     Status:  None   Collection Time: 06/19/18  9:12 AM  Result Value Ref Range   LDH 135 98 - 192 U/L  Protein / creatinine ratio, urine     Status: Abnormal   Collection Time: 06/19/18  2:28 PM  Result Value Ref Range   Creatinine, Urine 68.00 mg/dL   Total Protein, Urine 16 mg/dL   Protein Creatinine Ratio 0.24 (H) 0.00 - 0.15 mg/mg[Cre]     FHT:  FHR: 130 bpm, variability: moderate,  accelerations:  Present,  decelerations:  Absent UC:   regular, every 2-3 minutes SVE:   Dilation: 4.5 Effacement (%): 60 Station: -2 Exam by:: Lorn Junes. Goodman, RN   Labs: Lab Results  Component Value Date   WBC 6.1 06/19/2018   HGB 11.7 (L) 06/19/2018   HCT 34.6 (L) 06/19/2018   MCV 94.0 06/19/2018   PLT 185 06/19/2018    Assessment / Plan: Induction of labor due to term with favorable cervix,  progressing well on pitocin  Labor: Progressing normally Preeclampsia:  no signs or symptoms of toxicity, intake and ouput balanced and labs stable Fetal Wellbeing:  Category I Pain Control:  Labor support without medications I/D:  n/a Anticipated MOD:  NSVD  Stacy [name] 06/19/2018, 4:08 PM

## 2018-06-19 NOTE — Anesthesia Procedure Notes (Signed)
Epidural Patient location during procedure: OB Start time: 06/19/2018 8:20 PM End time: 06/19/2018 8:23 PM  Staffing Anesthesiologist: Leilani Able, MD Performed: anesthesiologist   Preanesthetic Checklist Completed: patient identified, site marked, surgical consent, pre-op evaluation, timeout performed, IV checked, risks and benefits discussed and monitors and equipment checked  Epidural Patient position: sitting Prep: site prepped and draped and DuraPrep Patient monitoring: continuous pulse ox and blood pressure Approach: midline Location: L3-L4 Injection technique: LOR air  Needle:  Needle type: Tuohy  Needle gauge: 17 G Needle length: 9 cm and 9 Needle insertion depth: 7 cm Catheter type: closed end flexible Catheter size: 19 Gauge Catheter at skin depth: 12 cm Test dose: negative and Other  Assessment Sensory level: T9 Events: blood not aspirated, injection not painful, no injection resistance, negative IV test and no paresthesia  Additional Notes Reason for block:procedure for pain
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
## 2018-06-19 NOTE — Anesthesia Pain Management Evaluation Note (Signed)
  CRNA Pain Management Visit Note  Patient: Stacy [name], 38 y.o., female  "Hello I am a member of the anesthesia team at [facility]. We have an anesthesia team available at all times to provide care throughout the hospital, including epidural management and anesthesia for C-section. I don't know your plan for the delivery whether it a natural birth, water birth, IV sedation, nitrous supplementation, doula or epidural, but we want to meet your pain goals."   1.Was your pain managed to your expectations on prior hospitalizations?   Yes   2.What is your expectation for pain management during this hospitalization?     Labor support without medications, Epidural and IV pain meds  3.How can we help you reach that goal? Be available, desires natural if possible  Record the patient's initial score and the patient's pain goal.   Pain: 0  Pain Goal: 6 [facility] wants you to be able to say your pain was always managed very well.  [facility] 06/19/2018

## 2018-06-19 NOTE — Anesthesia Preprocedure Evaluation (Signed)
Anesthesia Evaluation  Patient identified by MRN, date of birth, ID band Patient awake    Reviewed: Allergy & Precautions, H&P , NPO status , Patient's Chart, lab work & pertinent test results  Airway Mallampati: I  TM Distance: >3 FB Neck ROM: full    Dental no notable dental hx. (+) Teeth Intact   Pulmonary neg pulmonary ROS, Current Smoker,    Pulmonary exam normal breath sounds clear to auscultation       Cardiovascular negative cardio ROS Normal cardiovascular exam Rhythm:regular Rate:Normal     Neuro/Psych negative psych ROS   GI/Hepatic negative GI ROS, Neg liver ROS,   Endo/Other  negative endocrine ROSdiabetes  Renal/GU negative Renal ROS     Musculoskeletal negative musculoskeletal ROS (+)   Abdominal Normal abdominal exam  (+)   Peds  Hematology negative hematology ROS (+)   Anesthesia Other Findings   Reproductive/Obstetrics (+) Pregnancy                             Anesthesia Physical Anesthesia Plan  ASA: II  Anesthesia Plan: Epidural   Post-op Pain Management:    Induction:   PONV Risk Score and Plan:   Airway Management Planned:   Additional Equipment:   Intra-op Plan:   Post-operative Plan:   Informed Consent: I have reviewed the patients History and Physical, chart, labs and discussed the procedure including the risks, benefits and alternatives for the proposed anesthesia with the patient or authorized representative who has indicated his/her understanding and acceptance.       Plan Discussed with:   Anesthesia Plan Comments:         Anesthesia Quick Evaluation

## 2018-06-19 NOTE — Consult Note (Signed)
Neonatology Note:   Attendance at Delivery:    I was asked by Dr. Despina Hidden to attend this vacuum-assisted vaginal delivery at term due to FHR decelerations. The mother is a G3P2 B pos, GBS positive with history of successful VBAC. ROM 3.5 hours before delivery, fluid clear; mother treated with Pen G > 4 hours prior to delivery. Infant vigorous with good spontaneous cry and tone. Delayed cord clamping was not done. Needed only minimal bulb suctioning. Ap 9/9. Lungs clear to ausc in DR. Infant is able to remain with his mother for skin to skin time under nursing supervision. Transferred to the care of Pediatrician.   Doretha Sou, MD

## 2018-06-19 NOTE — Progress Notes (Signed)
Stacy [name] is a 38 y.o. G3P1101 at 2161w1d admitted for induction of labor due to Elective at term, TOLAC.  Subjective: Patient reports she is feeling moderate to strong regular contractions, 6-8/10 on pain scale. She cannot speak through contractions. Patient did have one elevated blood pressure earlier in the day, before she was uncomfortable. The most recent blood pressure is more elevated than patient's normal (may be pain related). However we will sent preeclampsia labs to rule out any preeclampsia.   Objective: Vitals:   06/19/18 1113 06/19/18 1131 06/19/18 1201 06/19/18 1231  BP: (!) 146/83 132/75 123/72 131/78  Pulse: 67 (!) 58 62 66  Resp: 18 18 18    Weight:      Height:       FHT:  FHR: 130 bpm, variability: moderate,  accelerations:  Present,  decelerations:  Absent UC:   regular, every 2-3 minutes SVE:   Dilation: 2 Effacement (%): 60 Station: -2 Exam by:: Rennis HardingEllis, CNM  Labs: Lab Results  Component Value Date   WBC 6.1 06/19/2018   HGB 11.7 (L) 06/19/2018   HCT 34.6 (L) 06/19/2018   MCV 94.0 06/19/2018   PLT 185 06/19/2018    Assessment / Plan: Induction of labor due to term with favorable cervix,  progressing well on pitocin  Labor: Progressing on Pitocin, will continue to increase then AROM Preeclampsia:  no signs or symptoms of toxicity, intake and ouput balanced and labs stable Fetal Wellbeing:  Category I Pain Control:  Labor support without medications I/D:  n/a Anticipated MOD:  NSVD  Janeece Riggersllis K Javyon Fontan 06/19/2018, 1:30 PM

## 2018-06-19 NOTE — Progress Notes (Signed)
Stacy [name] is a 38 y.o. G3P1101 at [redacted]w[redacted]d admitted for induction of labor due to Elective at term, TOLAC.  Subjective: Patient reports she is feeling moderate to strong regular contractions and cannot talk though them, she rates them between 6-8/10. She has been up and changing position regularly.   Objective: Vitals:   06/19/18 1430 06/19/18 1531 06/19/18 1600 06/19/18 1632  BP: 136/78 125/78 120/71 124/79  Pulse: 69 66 66 64  Resp: 16 16 20 18   Temp:      TempSrc:      Weight:      Height:       FHT:  FHR: 130 bpm, variability: moderate,  accelerations:  Present,  decelerations:  Absent UC:   regular, every 2-3 minutes SVE:   Dilation: 4.5 Effacement (%): 60 Station: -2 Exam by:: Lorn Junes, RN   Labs: Lab Results  Component Value Date   WBC 6.1 06/19/2018   HGB 11.7 (L) 06/19/2018   HCT 34.6 (L) 06/19/2018   MCV 94.0 06/19/2018   PLT 185 06/19/2018    Assessment / Plan: Induction of labor due to term with favorable cervix,  progressing well on pitocin  Labor: Progressing normally Preeclampsia:  no signs or symptoms of toxicity, intake and ouput balanced and labs stable Fetal Wellbeing:  Category I Pain Control:  Labor support without medications I/D:  n/a Anticipated MOD:  NSVD  Janeece Riggers 06/19/2018, 6:09 PM

## 2018-06-19 NOTE — Progress Notes (Signed)
Subjective: Pt is comfortable.  Not feeling contractions.    Objective: BP 128/79   Pulse 71   Temp 98.2 F (36.8 C) (Oral)   Resp 18   Ht 5\' 5"  (1.651 m)   Wt 78 kg   LMP  (LMP Unknown)   BMI 28.62 kg/m  No intake/output data recorded. No intake/output data recorded.  FHT: Category 1 UC:   regular, every 3 minutes SVE:   Dilation: 4.5 Effacement (%): 60 Station: -2 Exam by:: Bernerd Pho, CNM Pitocin at 6 mu Discussed AROM and IUPC placement.  Pt agreed to poc.  IUPC placed without difficulty.  Assessment:  G3P1101 at 40.1 IUP induction at term Previous LTCS Cat 1 strip  Plan: Monitor progress Anticipate SVD  Kenney Houseman CNM, MSN 06/19/2018, 8:06 PM

## 2018-06-20 ENCOUNTER — Other Ambulatory Visit: Payer: Self-pay

## 2018-06-20 ENCOUNTER — Encounter (HOSPITAL_COMMUNITY): Payer: Self-pay

## 2018-06-20 LAB — CBC
HCT: 29 % — ABNORMAL LOW (ref 36.0–46.0)
Hemoglobin: 9.8 g/dL — ABNORMAL LOW (ref 12.0–15.0)
MCH: 31.2 pg (ref 26.0–34.0)
MCHC: 33.8 g/dL (ref 30.0–36.0)
MCV: 92.4 fL (ref 80.0–100.0)
Platelets: 157 10*3/uL (ref 150–400)
RBC: 3.14 MIL/uL — ABNORMAL LOW (ref 3.87–5.11)
RDW: 14.7 % (ref 11.5–15.5)
WBC: 10.8 10*3/uL — ABNORMAL HIGH (ref 4.0–10.5)
nRBC: 0 % (ref 0.0–0.2)

## 2018-06-20 LAB — RPR: RPR Ser Ql: NONREACTIVE

## 2018-06-20 MED ORDER — ONDANSETRON HCL 4 MG PO TABS: 4.0000 mg | ORAL_TABLET | ORAL | Status: DC | PRN

## 2018-06-20 MED ORDER — ONDANSETRON HCL 4 MG/2ML IJ SOLN: 4.0000 mg | INTRAMUSCULAR | Status: DC | PRN

## 2018-06-20 MED ORDER — SENNOSIDES-DOCUSATE SODIUM 8.6-50 MG PO TABS
2.0000 | ORAL_TABLET | ORAL | Status: DC
  Administered 2018-06-20 (×2): 2 via ORAL
  Filled 2018-06-20 (×2): qty 2

## 2018-06-20 MED ORDER — ZOLPIDEM TARTRATE 5 MG PO TABS: 5.0000 mg | ORAL_TABLET | Freq: Every evening | ORAL | Status: DC | PRN

## 2018-06-20 MED ORDER — COCONUT OIL OIL: 1.0000 "application " | TOPICAL_OIL | Status: DC | PRN

## 2018-06-20 MED ORDER — WITCH HAZEL-GLYCERIN EX PADS
1.0000 "application " | MEDICATED_PAD | CUTANEOUS | Status: DC | PRN
  Administered 2018-06-20: 1 via TOPICAL

## 2018-06-20 MED ORDER — DOCUSATE SODIUM 100 MG PO CAPS
100.0000 mg | ORAL_CAPSULE | Freq: Two times a day (BID) | ORAL | Status: DC
  Administered 2018-06-20 – 2018-06-21 (×4): 100 mg via ORAL
  Filled 2018-06-20 (×5): qty 1

## 2018-06-20 MED ORDER — BENZOCAINE-MENTHOL 20-0.5 % EX AERO
1.0000 "application " | INHALATION_SPRAY | CUTANEOUS | Status: DC | PRN
  Administered 2018-06-20: 1 via TOPICAL
  Filled 2018-06-20: qty 56

## 2018-06-20 MED ORDER — IBUPROFEN 600 MG PO TABS
600.0000 mg | ORAL_TABLET | Freq: Four times a day (QID) | ORAL | Status: DC
  Administered 2018-06-20 – 2018-06-21 (×5): 600 mg via ORAL
  Filled 2018-06-20 (×6): qty 1

## 2018-06-20 MED ORDER — DIPHENHYDRAMINE HCL 25 MG PO CAPS: 25.0000 mg | ORAL_CAPSULE | Freq: Four times a day (QID) | ORAL | Status: DC | PRN

## 2018-06-20 MED ORDER — SIMETHICONE 80 MG PO CHEW: 80.0000 mg | CHEWABLE_TABLET | ORAL | Status: DC | PRN

## 2018-06-20 MED ORDER — OXYCODONE HCL 5 MG PO TABS
5.0000 mg | ORAL_TABLET | ORAL | Status: DC | PRN
  Administered 2018-06-20 (×2): 5 mg via ORAL
  Filled 2018-06-20: qty 1
  Filled 2018-06-20: qty 2

## 2018-06-20 MED ORDER — ACETAMINOPHEN 325 MG PO TABS
650.0000 mg | ORAL_TABLET | ORAL | Status: DC | PRN
  Administered 2018-06-20 – 2018-06-21 (×6): 650 mg via ORAL
  Filled 2018-06-20 (×6): qty 2

## 2018-06-20 MED ORDER — TETANUS-DIPHTH-ACELL PERTUSSIS 5-2.5-18.5 LF-MCG/0.5 IM SUSP: 0.5000 mL | Freq: Once | INTRAMUSCULAR | Status: DC

## 2018-06-20 MED ORDER — POLYETHYLENE GLYCOL 3350 17 G PO PACK
17.0000 g | PACK | Freq: Every day | ORAL | Status: DC
  Administered 2018-06-20 – 2018-06-21 (×2): 17 g via ORAL
  Filled 2018-06-20 (×2): qty 1

## 2018-06-20 MED ORDER — FERROUS SULFATE 325 (65 FE) MG PO TABS
325.0000 mg | ORAL_TABLET | Freq: Every day | ORAL | Status: DC
  Administered 2018-06-20 – 2018-06-21 (×2): 325 mg via ORAL
  Filled 2018-06-20 (×2): qty 1

## 2018-06-20 MED ORDER — DIBUCAINE 1 % RE OINT
1.0000 "application " | TOPICAL_OINTMENT | RECTAL | Status: DC | PRN
  Administered 2018-06-20: 1 via RECTAL
  Filled 2018-06-20: qty 28

## 2018-06-20 MED ORDER — PRENATAL MULTIVITAMIN CH
1.0000 | ORAL_TABLET | Freq: Every day | ORAL | Status: DC
  Administered 2018-06-20: 1 via ORAL
  Filled 2018-06-20: qty 1

## 2018-06-20 NOTE — Progress Notes (Signed)
CSW acknowledges consult and completed clinical assessment. Clinical documentation will follow.  There are no barriers to d/c.  Khalin Royce, LCSWA Clinical Social Worker [facility] Cell#: [phone]  

## 2018-06-20 NOTE — Progress Notes (Signed)
Post Partum Day 1  Subjective: voiding and tolerating PO. Patient reports her vagina is painful despite pain medication. She has a 4th degree tear and has not been able to get comfortable. She has not yet been able to sleep. Encouraged her to take pain medication as needed and reiterated the extent of her injury. Patient verbalizes she will report any signs of infection or if she notes fecal material in the vagina. Encouraged PO hydration and high fiber diet as well as continued stool softener use. Patient had minor fall this morning, possibly related to medication used in labor or to mild anemia. Will treat with PO Iron QD.   Objective: Vitals:   06/20/18 0115 06/20/18 0207 06/20/18 0400 06/20/18 0618  BP: (!) 137/59 126/76 108/62 100/60  Pulse: 71 68 64 66  Resp: 17 16 16 16   Temp: 98.5 F (36.9 C) 98.4 F (36.9 C) 98.7 F (37.1 C) 98.7 F (37.1 C)  TempSrc: Oral Oral Oral Oral  SpO2:   97% 97%  Weight:      Height:        Physical Exam:  General: alert and cooperative Lochia: appropriate Uterine Fundus: firm Incision: n/a DVT Evaluation: No evidence of DVT seen on physical exam. Negative Homan's sign. No cords or calf tenderness. No significant calf/ankle edema.  Recent Labs    06/19/18 0809 06/20/18 0536  HGB 11.7* 9.8*  HCT 34.6* 29.0*    Assessment/Plan: Plan for discharge tomorrow and Breastfeeding  PO Iron QD for mild anemia    LOS: 1 day   Stacy [name] 06/20/2018, 8:21 AM

## 2018-06-20 NOTE — Lactation Note (Signed)
This note was copied from a baby's chart. 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  Baby fussing in crib and LC offered to check his diaper / large mec stool changed.  Baby due to feed and LC handed mom the baby with a bottle .  LC reviewed how to dry up her milk / cold cabbage leaves until they wilt and then change out/ tight  Fitting bra 24 hours a day  Until breast are softer/ backwards shower / and avoid stimulation.  LC provided the [facility] pamphlet with phone number if needed when drying up milk.    Maternal Data Does the patient have breastfeeding experience prior to this delivery?: Yes  Feeding    LATCH Score                   Interventions    Lactation Tools Discussed/Used     Consult Status Consult Status: Complete    [name] 06/20/2018, 11:18 AM

## 2018-06-20 NOTE — Anesthesia Postprocedure Evaluation (Signed)
Anesthesia Post Note  Patient: Stacy [name]  Procedure(s) Performed: AN AD HOC LABOR EPIDURAL     Patient location during evaluation: Mother Baby Anesthesia Type: Epidural Level of consciousness: awake and alert and oriented Pain management: satisfactory to patient Vital Signs Assessment: post-procedure vital signs reviewed and stable Respiratory status: spontaneous breathing and nonlabored ventilation Cardiovascular status: stable Postop Assessment: no headache, no backache, no signs of nausea or vomiting, adequate PO intake and patient able to bend at knees (patient up walking) Anesthetic complications: no    Last Vitals:  Vitals:   06/20/18 0618 06/20/18 0907  BP: 100/60 109/62  Pulse: 66 60  Resp: 16 18  Temp: 37.1 C 36.8 C  SpO2: 97%     Last Pain:  Vitals:   06/20/18 0907  TempSrc: Oral  PainSc: 5    Pain Goal:                   Madison HickmanGREGORY,[name]

## 2018-06-20 NOTE — Progress Notes (Signed)
06/20/18 0400  What Happened  Was fall witnessed? Yes  Who witnessed fall? Stacy [name], Stacy [name]  Patients activity before fall bathroom-assisted  Point of contact other (comment) (right knee)  Was patient injured? No  Follow Up  MD notified Bernerd PhoNancy Prothero  Time MD notified 0500  Family notified Yes-comment (in pts room at time of fall)  Time family notified 0400  Additional tests No  Progress note created (see row info) Yes  Adult Fall Risk Assessment  Risk Factor Category (scoring not indicated) High fall risk per protocol (document High fall risk)  Age 38  Fall History: Fall within 6 months prior to admission 5  Elimination; Bowel and/or Urine Incontinence 0  Elimination; Bowel and/or Urine Urgency/Frequency 0  Medications: includes PCA/Opiates, Anti-convulsants, Anti-hypertensives, Diuretics, Hypnotics, Laxatives, Sedatives, and Psychotropics 3  Patient Care Equipment 0  Mobility-Assistance 2  Mobility-Gait 2  Mobility-Sensory Deficit 0  Altered awareness of immediate physical environment 0  Impulsiveness 0  Lack of understanding of one's physical/cognitive limitations 0  Total Score 12  Patient Fall Risk Level High fall risk  Adult Fall Risk Interventions  Required Bundle Interventions *See Row Information* High fall risk - low, moderate, and high requirements implemented  Screening for Fall Injury Risk (To be completed on HIGH fall risk patients) - Assessing Need for Low Bed  Risk For Fall Injury- Low Bed Criteria None identified - Continue screening  Screening for Fall Injury Risk (To be completed on HIGH fall risk patients who do not meet crieteria for Low Bed) - Assessing Need for Floor Mats Only  Risk For Fall Injury- Criteria for Floor Mats None identified - No additional interventions needed  Vitals  Temp 98.7 F (37.1 C)  Temp Source Oral  BP 108/62  BP Location Right Arm  BP Method Automatic  Patient Position (if appropriate) Sitting  Pulse  Rate 64  Pulse Rate Source Dinamap  Resp 16  Oxygen Therapy  SpO2 97 %  Pain Assessment  Pain Scale 0-10 (pain present before fall, no new pain or increased pain post)  Pain Score 6  Pain Location Perineum  Pain Descriptors / Indicators Discomfort  Pain Frequency Intermittent  Pain Onset On-going  Pain Intervention(s) RN made aware  Neurological  Neuro (WDL) WDL  Musculoskeletal  Musculoskeletal (WDL) WDL  Integumentary  Integumentary (WDL) WDL  Pain Assessment  Date Pain First Started 06/19/18  Result of Injury No  Pain Screening  Clinical Progression Gradually worsening  Effect of Pain on Daily Activities pain did not cause the fall and no pain caused from fall  Pain Assessment  Work-Related Injury No  Pt called out for assistance in using bathroom for first time. RN 1 assisted pt to bathroom. RN 1 had pt sit at bed side then slowly stand. Pt did not complain of any dizziness or weakness. RN 1 walked beside pt to bathroom. Pt able to ambulate without any assistance. RN 1 asked pt if she needed help removing underwear. Pt declined help. Pt sat down on toilet without issues. RN 1 helped pt get supply together to change pads and underwear. Pt put on new pad and underwear without issues. RN 2 entered room to take over for RN 1. RN 2 asked pt how she was feeling and she said fine. RN 1 turned and explained to RN 2 that pt did good standing and ambulating to bathroom on own. While RN 1 was turned pt yelled out. Both RNs turn toward pt to see pts  right knee on the floor and both hands on side rail. RN asked pt if she was okay and she said yes. RNs asked if her knee was hurting at all,  pt stated it wasn't and her right leg just gave out while she was trying to stand. RNs helped pt back onto toilet. RN 1 got the stedy to get pt back to bed while RN 2 stood with pt. Pt did not complain of any pain or hurting. RNs helped pt stand and sit back down on stedy and took pt back to bed. VS WNL. Pt  stable and states there is no  new pain caused by fall.

## 2018-06-20 NOTE — Progress Notes (Deleted)
Pt called out for assistance in using bathroom for first time. RN 1 assisted pt to bathroom. RN 1 had pt sit at bed side then slowly stand. Pt did not complain of any dizziness or weakness. RN 1 walked beside pt to bathroom. Pt able to ambulate without any assistance. RN 1 asked pt if she needed help removing underwear. Pt declined help. Pt sat down on toilet without issues. RN 1 helped pt get supply together to change pads and underwear. Pt put on new pad and underwear without issues. RN 2 entered room to take over for RN 1. RN 2 asked pt how she was feeling and she said fine. RN 1 turned and explained to RN 2 that pt did good standing and ambulating to bathroom on own. While RN 1 was turned pt yelled out. Both RNs turn toward pt to see pts right knee on the floor and both hands on side rail. RN asked pt if she was okay and she said yes. RNs asked if her knee was hurting at all,  pt stated it wasn't and her right leg just gave out while she was trying to stand. RNs helped pt back onto toilet. RN 1 got the stedy to get pt back to bed while RN 2 stood with pt. Pt did not complain of any pain or hurting. RNs helped pt stand and sit back down on stedy and took pt back to bed. VS WNL. Pt stable and states there is no  new pain caused by fall.

## 2018-06-21 DIAGNOSIS — O9081 Anemia of the puerperium: Secondary | ICD-10-CM

## 2018-06-21 MED ORDER — FERROUS SULFATE 325 (65 FE) MG PO TABS: 325.0000 mg | ORAL_TABLET | Freq: Every day | ORAL | 3 refills | Status: AC

## 2018-06-21 MED ORDER — IBUPROFEN 600 MG PO TABS: 600.0000 mg | ORAL_TABLET | Freq: Four times a day (QID) | ORAL | 0 refills | Status: AC

## 2018-06-21 NOTE — Progress Notes (Signed)
CSW acknowledged consult for edinburgh score 14. CSW seen [facility] on 06/20/18 and provided PMADs education/resources. No further intervention needed and no barriers to discharge at this time.  Celso Sickle, LCSWA Clinical Social Worker [facility] Cell#: [phone]
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
## 2018-06-21 NOTE — Progress Notes (Signed)
CSW received consult for hx of Anxiety and Depression.  CSW met with MOB to offer support and complete assessment.    CSW met with MOB at bedside to discuss consult for history of anxiety. CSW introduced self and explained reason for consult. MOB was welcoming and pleasant during assessment. CSW and MOB discussed MOB's mental health history, MOB reported that she was diagnosed with anxiety at 38 years old. MOB denied any current symptoms but reported situational stressors. [place] MOB reported that she sees her doctors in [place] and her family lives here. MOB reported that she is [place] and also not getting to see her 38 year old enough. CSW normalized and validated MOB's feelings. CSW inquired about MOB's history with postpartum depression. MOB reported that she may have had undiagnosed PPD after having her 38 year old. MOB reported that her symptoms were sadness and emotional meltdowns that lasted about 18 months. MOB reported that it didn't happen daily or weekly but in spells. MOB and CSW discussed MOB's coping skills to treat/minimize her symptoms. MOB reported that she would have to take a minute and process things and also just validating herself. CSW praised MOB for healthy coping skills, MOB reported that they were helpful. CSW inquired about MOB's support system, MOB reported her FOB and family in [place] were her supports. MOB reported that they have everything needed to care for the baby and plan to stay in [place] til next week or until she feels comfortable driving home. MOB presented calm and was open during assessment. MOB possessed insight about her mental health history and coping skills. MOB did not demonstrate any acute mental health signs/symptoms. CSW assessed for safety, MOB denied SI, HI and domestic violence.     CSW provided education regarding the baby blues period vs. perinatal mood disorders, discussed treatment and gave resources for mental health follow up if concerns arise.  CSW recommends self-evaluation during the postpartum time period using the New Mom Checklist from Postpartum Progress and encouraged MOB to contact a medical professional if symptoms are noted at any time.    CSW identifies no further need for intervention and no barriers to discharge at this time.  Dariah Mcsorley, LCSWA Clinical Social Worker [facility] Cell#: [phone] 

## 2018-07-08 ENCOUNTER — Inpatient Hospital Stay (HOSPITAL_COMMUNITY)
Admission: AD | Admit: 2018-07-08 | Discharge: 2018-07-10 | DRG: 776 | Disposition: A | Discharge status: Home / Self Care | Payer: BLUE CROSS/BLUE SHIELD | Attending: Obstetrics and Gynecology | Admitting: Obstetrics and Gynecology

## 2018-07-08 ENCOUNTER — Encounter (HOSPITAL_COMMUNITY): Payer: Self-pay | Admitting: *Deleted

## 2018-07-08 ENCOUNTER — Other Ambulatory Visit: Payer: Self-pay

## 2018-07-08 DIAGNOSIS — O99335 Smoking (tobacco) complicating the puerperium: Secondary | ICD-10-CM | POA: Diagnosis present

## 2018-07-08 DIAGNOSIS — O1415 Severe pre-eclampsia, complicating the puerperium: Principal | ICD-10-CM | POA: Diagnosis present

## 2018-07-08 DIAGNOSIS — O99345 Other mental disorders complicating the puerperium: Secondary | ICD-10-CM | POA: Diagnosis present

## 2018-07-08 DIAGNOSIS — F53 Postpartum depression: Secondary | ICD-10-CM | POA: Diagnosis present

## 2018-07-08 DIAGNOSIS — F17210 Nicotine dependence, cigarettes, uncomplicated: Secondary | ICD-10-CM | POA: Diagnosis present

## 2018-07-08 DIAGNOSIS — O1410 Severe pre-eclampsia, unspecified trimester: Secondary | ICD-10-CM | POA: Diagnosis present

## 2018-07-08 LAB — CBC WITH DIFFERENTIAL/PLATELET
Abs Immature Granulocytes: 0.01 10*3/uL (ref 0.00–0.07)
Basophils Absolute: 0 10*3/uL (ref 0.0–0.1)
Basophils Relative: 1 %
Eosinophils Absolute: 0.2 10*3/uL (ref 0.0–0.5)
Eosinophils Relative: 3 %
HCT: 42.1 % (ref 36.0–46.0)
Hemoglobin: 13.4 g/dL (ref 12.0–15.0)
IMMATURE GRANULOCYTES: 0 %
Lymphocytes Relative: 50 %
Lymphs Abs: 2.5 10*3/uL (ref 0.7–4.0)
MCH: 30.2 pg (ref 26.0–34.0)
MCHC: 31.8 g/dL (ref 30.0–36.0)
MCV: 94.8 fL (ref 80.0–100.0)
Monocytes Absolute: 0.3 10*3/uL (ref 0.1–1.0)
Monocytes Relative: 7 %
Neutro Abs: 2 10*3/uL (ref 1.7–7.7)
Neutrophils Relative %: 39 %
PLATELETS: 343 10*3/uL (ref 150–400)
RBC: 4.44 MIL/uL (ref 3.87–5.11)
RDW: 14.6 % (ref 11.5–15.5)
WBC: 5 10*3/uL (ref 4.0–10.5)
nRBC: 0 % (ref 0.0–0.2)

## 2018-07-08 LAB — LACTATE DEHYDROGENASE: LDH: 165 U/L (ref 98–192)

## 2018-07-08 LAB — COMPREHENSIVE METABOLIC PANEL
ALT: 17 U/L (ref 0–44)
AST: 23 U/L (ref 15–41)
Albumin: 4 g/dL (ref 3.5–5.0)
Alkaline Phosphatase: 82 U/L (ref 38–126)
Anion gap: 7 (ref 5–15)
BUN: 11 mg/dL (ref 6–20)
CHLORIDE: 106 mmol/L (ref 98–111)
CO2: 25 mmol/L (ref 22–32)
Calcium: 9.2 mg/dL (ref 8.9–10.3)
Creatinine, Ser: 0.55 mg/dL (ref 0.44–1.00)
GFR calc Af Amer: >60 mL/min (ref >60)
GFR calc non Af Amer: >60 mL/min (ref >60)
Glucose, Bld: 76 mg/dL (ref 70–99)
Potassium: 4 mmol/L (ref 3.5–5.1)
Sodium: 138 mmol/L (ref 135–145)
Total Bilirubin: 0.5 mg/dL (ref 0.3–1.2)
Total Protein: 7.3 g/dL (ref 6.5–8.1)

## 2018-07-08 LAB — PROTEIN / CREATININE RATIO, URINE
Creatinine, Urine: 44.92 mg/dL
PROTEIN CREATININE RATIO: 0.18 mg/mg{creat} — AB (ref 0.00–0.15)
Total Protein, Urine: 8 mg/dL

## 2018-07-08 LAB — TYPE AND SCREEN
ABO/RH(D): B POS
ANTIBODY SCREEN: NEGATIVE

## 2018-07-08 LAB — URIC ACID: Uric Acid, Serum: 4.4 mg/dL (ref 2.5–7.1)

## 2018-07-08 MED ORDER — MAGNESIUM SULFATE 40 G IN LACTATED RINGERS - SIMPLE
2.0000 g/h | INTRAVENOUS | Status: AC
  Filled 2018-07-08: qty 500

## 2018-07-08 MED ORDER — LABETALOL HCL 5 MG/ML IV SOLN
40.0000 mg | INTRAVENOUS | Status: DC | PRN
  Administered 2018-07-09 (×2): 40 mg via INTRAVENOUS
  Filled 2018-07-08 (×2): qty 8

## 2018-07-08 MED ORDER — LABETALOL HCL 5 MG/ML IV SOLN
20.0000 mg | INTRAVENOUS | Status: DC | PRN
  Administered 2018-07-08: 20 mg via INTRAVENOUS
  Filled 2018-07-08: qty 4

## 2018-07-08 MED ORDER — LABETALOL HCL 5 MG/ML IV SOLN: 80.0000 mg | INTRAVENOUS | Status: DC | PRN

## 2018-07-08 MED ORDER — LABETALOL HCL 5 MG/ML IV SOLN
80.0000 mg | INTRAVENOUS | Status: DC | PRN
  Administered 2018-07-08: 80 mg via INTRAVENOUS
  Filled 2018-07-08: qty 16

## 2018-07-08 MED ORDER — HYDRALAZINE HCL 20 MG/ML IJ SOLN: 10.0000 mg | INTRAMUSCULAR | Status: DC | PRN

## 2018-07-08 MED ORDER — LABETALOL HCL 5 MG/ML IV SOLN
40.0000 mg | INTRAVENOUS | Status: DC | PRN
  Administered 2018-07-08: 40 mg via INTRAVENOUS
  Filled 2018-07-08 (×2): qty 8

## 2018-07-08 MED ORDER — MAGNESIUM SULFATE BOLUS VIA INFUSION
4.0000 g | Freq: Once | INTRAVENOUS | Status: AC
  Administered 2018-07-08: 4 g via INTRAVENOUS
  Filled 2018-07-08: qty 500

## 2018-07-08 MED ORDER — LACTATED RINGERS IV SOLN
INTRAVENOUS | Status: DC
  Administered 2018-07-08 – 2018-07-09 (×6): via INTRAVENOUS

## 2018-07-08 MED ORDER — LABETALOL HCL 5 MG/ML IV SOLN
20.0000 mg | INTRAVENOUS | Status: DC | PRN
  Administered 2018-07-09 (×2): 20 mg via INTRAVENOUS
  Filled 2018-07-08 (×2): qty 4

## 2018-07-08 MED ORDER — CYCLOBENZAPRINE HCL 10 MG PO TABS
10.0000 mg | ORAL_TABLET | Freq: Once | ORAL | Status: DC
  Filled 2018-07-08: qty 1
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
## 2018-07-08 NOTE — MAU Note (Addendum)
Induced on 2/5, delivered vag.  When they vacuumed the baby, she ended up with a 4th degree. Has had a lingering HA.  Some neck pain.  BP has been going up since then. Denies visual changes, epigastric pain or  Increase in swelling.

## 2018-07-09 ENCOUNTER — Encounter (HOSPITAL_COMMUNITY): Payer: Self-pay

## 2018-07-09 LAB — COMPREHENSIVE METABOLIC PANEL
ALT: 15 U/L (ref 0–44)
AST: 23 U/L (ref 15–41)
Albumin: 3.4 g/dL — ABNORMAL LOW (ref 3.5–5.0)
Alkaline Phosphatase: 78 U/L (ref 38–126)
Anion gap: 8 (ref 5–15)
BUN: 12 mg/dL (ref 6–20)
CO2: 24 mmol/L (ref 22–32)
Calcium: 7.8 mg/dL — ABNORMAL LOW (ref 8.9–10.3)
Chloride: 108 mmol/L (ref 98–111)
Creatinine, Ser: 0.54 mg/dL (ref 0.44–1.00)
GFR calc Af Amer: >60 mL/min (ref >60)
GFR calc non Af Amer: >60 mL/min (ref >60)
GLUCOSE: 114 mg/dL — AB (ref 70–99)
Potassium: 3.5 mmol/L (ref 3.5–5.1)
SODIUM: 140 mmol/L (ref 135–145)
Total Bilirubin: 0.5 mg/dL (ref 0.3–1.2)
Total Protein: 6.3 g/dL — ABNORMAL LOW (ref 6.5–8.1)

## 2018-07-09 LAB — CBC
HCT: 35.4 % — ABNORMAL LOW (ref 36.0–46.0)
Hemoglobin: 11.6 g/dL — ABNORMAL LOW (ref 12.0–15.0)
MCH: 30.5 pg (ref 26.0–34.0)
MCHC: 32.8 g/dL (ref 30.0–36.0)
MCV: 93.2 fL (ref 80.0–100.0)
Platelets: 320 10*3/uL (ref 150–400)
RBC: 3.8 MIL/uL — ABNORMAL LOW (ref 3.87–5.11)
RDW: 14.6 % (ref 11.5–15.5)
WBC: 4.8 10*3/uL (ref 4.0–10.5)
nRBC: 0 % (ref 0.0–0.2)

## 2018-07-09 LAB — PATHOLOGIST SMEAR REVIEW

## 2018-07-09 LAB — MAGNESIUM: Magnesium: 4.8 mg/dL — ABNORMAL HIGH (ref 1.7–2.4)

## 2018-07-09 LAB — ABO/RH: ABO/RH(D): B POS

## 2018-07-09 MED ORDER — NIFEDIPINE ER OSMOTIC RELEASE 30 MG PO TB24
60.0000 mg | ORAL_TABLET | Freq: Every day | ORAL | Status: DC
  Administered 2018-07-10: 60 mg via ORAL
  Filled 2018-07-09: qty 2

## 2018-07-09 MED ORDER — NIFEDIPINE ER OSMOTIC RELEASE 30 MG PO TB24
30.0000 mg | ORAL_TABLET | Freq: Once | ORAL | Status: AC
  Administered 2018-07-09: 30 mg via ORAL
  Filled 2018-07-09: qty 1

## 2018-07-09 MED ORDER — ACETAMINOPHEN 500 MG PO TABS
1000.0000 mg | ORAL_TABLET | Freq: Three times a day (TID) | ORAL | Status: DC | PRN
  Administered 2018-07-09 – 2018-07-10 (×3): 1000 mg via ORAL
  Filled 2018-07-09 (×3): qty 2
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
## 2018-07-09 NOTE — Progress Notes (Signed)
Contact with [facility], CNM in regards to BP of 167/93 and 179/93. Instructed to start Labetalol protocol. Will continue to monitor. Carmelina Dane, RN

## 2018-07-10 MED ORDER — NIFEDIPINE ER 60 MG PO TB24: 60.0000 mg | ORAL_TABLET | Freq: Every day | ORAL | 3 refills | Status: AC

## 2018-07-10 NOTE — Progress Notes (Addendum)
Post Partum Day 21  Subjective: up ad lib, voiding, tolerating PO and + flatus. Patient reports mild 4/10 headache this morning but has just taken Tylenol. She denies visual changes or RUQ pain. Patient is frustrated with hospitalization and how her health is affecting her work. I wrote the patient a letter excusing her from work until 8 weeks postpartum as the letter faxed from Florien PA from [employer] did not arrive at her office. She denied desiring a social work consult to [employer]. Patient reports her frustration is related to the situation she is in with her work, she denies symptoms of depression. She reports she is annoyed with having to repeat her story to each new provider she sees. She has a visit scheduled with Dr. Normand Sloop on Friday of this week and we can do a EPDS at that appointment.   Objective: Vitals:   07/09/18 2128 07/09/18 2355 07/10/18 0413 07/10/18 0755  BP: (!) 162/93 (!) 158/80 119/65 127/72  Pulse: (!) 54 60 62 (!) 58  Resp:  18 16 16   Temp:  98.3 F (36.8 C) 98.3 F (36.8 C) 98.7 F (37.1 C)  TempSrc:  Oral Oral Oral  SpO2:   98%   ' Physical Exam:  General: alert and cooperative Lochia: appropriate Uterine Fundus: firm Incision: n/a DVT Evaluation: No evidence of DVT seen on physical exam. Negative Homan's sign. No cords or calf tenderness. No significant calf/ankle edema.  Recent Labs    07/08/18 1930 07/09/18 0403  HGB 13.4 11.6*  HCT 42.1 35.4*    Assessment/Plan: 38 y.o. G3P2 at 21 days postpartum Postpartum preeclampsia with severe features S/p MgSO4, discontinued at 2100 yesterday Procardia XL 60mg  QD Mood stable- work excuse letter provided to patient  Per Dr. Normand Sloop: if patient's blood pressures remain stable and her headache improves, she is a candidate for discharge this evening.  Dr. Normand Sloop to round on patient today.    LOS: 2 days   Janeece Riggers 07/10/2018, 9:47 AM

## 2018-07-10 NOTE — Discharge Summary (Signed)
OB Discharge Summary     Patient Name: Stacy [name] DOB: 01-Apr-1981 MRN: [id number]  Date of admission: 07/08/2018 Delivering MD: This patient has no babies on file.  Date of discharge: 07/10/2018  Admitting diagnosis: PP Pre-e Intrauterine pregnancy: Unknown     Secondary diagnosis:  Active Problems:   Severe preeclampsia     Discharge diagnosis: Preeclampsia (severe)                                                                                                Post partum procedures:n/a  Physical exam  Vitals:   07/09/18 2355 07/10/18 0413 07/10/18 0755 07/10/18 1215  BP: (!) 158/80 119/65 127/72 121/80  Pulse: 60 62 (!) 58 (!) 51  Resp: 18 16 16 18   Temp: 98.3 F (36.8 C) 98.3 F (36.8 C) 98.7 F (37.1 C) 98.3 F (36.8 C)  TempSrc: Oral Oral Oral   SpO2:  98%     General: alert, cooperative and no distress. Stable mood without symptoms of depression.  Lochia: appropriate Uterine Fundus: firm Incision: N/A DVT Evaluation: No evidence of DVT seen on physical exam. Negative Homan's sign. No cords or calf tenderness. No significant calf/ankle edema. Preeclampsia: patient denies visual changes or RUQ pain at this time. She reports an intermittent moderate headache which is resolved by tylenol. Per Dr. Normand Sloop, patient is stable for discharge. Preeclampsia precautions discussed with patient and handout given. Patient to continue to check her blood pressure at home.   Labs: Lab Results  Component Value Date   WBC 4.8 07/09/2018   HGB 11.6 (L) 07/09/2018   HCT 35.4 (L) 07/09/2018   MCV 93.2 07/09/2018   PLT 320 07/09/2018   CMP Latest Ref Rng & Units 07/09/2018  Glucose 70 - 99 mg/dL 038(B)  BUN 6 - 20 mg/dL 12  Creatinine 3.38 - 3.29 mg/dL 1.91  Sodium 660 - 600 mmol/L 140  Potassium 3.5 - 5.1 mmol/L 3.5  Chloride 98 - 111 mmol/L 108  CO2 22 - 32 mmol/L 24  Calcium 8.9 - 10.3 mg/dL 7.8(L)  Total Protein 6.5 - 8.1 g/dL 6.3(L)  Total Bilirubin 0.3 - 1.2  mg/dL 0.5  Alkaline Phos 38 - 126 U/L 78  AST 15 - 41 U/L 23  ALT 0 - 44 U/L 15    Discharge instruction: per After Visit Summary and "Baby and Me Booklet". Preeclampsia handout given.   After visit meds:  Allergies as of 07/10/2018   No Known Allergies     Medication List    TAKE these medications   ferrous sulfate 325 (65 FE) MG tablet Take 1 tablet (325 mg total) by mouth daily with breakfast.   ibuprofen 600 MG tablet Commonly known as:  ADVIL,MOTRIN Take 1 tablet (600 mg total) by mouth every 6 (six) hours.   NIFEdipine 60 MG 24 hr tablet Commonly known as:  ADALAT CC Take 1 tablet (60 mg total) by mouth daily. Start taking on:  July 11, 2018   prenatal multivitamin Tabs tablet Take 1 tablet by mouth daily at 12 noon.  Diet: routine diet  Activity: Advance as tolerated. Pelvic rest for 3 weeks.   Outpatient follow up:2 days with Dr. Normand Sloop at [facility] Follow up Appt:No future appointments. Follow up Visit:No follow-ups on file.  07/10/2018 Janeece Riggers, CNM

## 2018-07-10 NOTE — Progress Notes (Signed)
Pt discharged home with printed instructions. Pt verbalized an understanding. No concerns noted. Aaliyan Brinkmeier L Caitlen Worth, RN 

## 2018-07-10 NOTE — Discharge Instructions (Signed)
Postpartum Hypertension Postpartum hypertension is high blood pressure that remains higher than normal after childbirth. You may not realize that you have postpartum hypertension if your blood pressure is not being checked regularly. In most cases, postpartum hypertension will go away on its own, usually within a week of delivery. However, for some women, medical treatment is required to prevent serious complications, such as seizures or stroke. What are the causes? This condition may be caused by one or more of the following:  Hypertension that existed before pregnancy (chronic hypertension).  Hypertension that comes on as a result of pregnancy (gestational hypertension).  Hypertensive disorders during pregnancy (preeclampsia) or seizures in women who have high blood pressure during pregnancy (eclampsia).  A condition in which the liver, platelets, and red blood cells are damaged during pregnancy (HELLP syndrome).  A condition in which the thyroid produces too much hormones (hyperthyroidism).  Other rare problems of the nerves (neurological disorders) or blood disorders. In some cases, the cause may not be known. What increases the risk? The following factors may make you more likely to develop this condition:  Chronic hypertension. In some cases, this may not have been diagnosed before pregnancy.  Obesity.  Type 2 diabetes.  Kidney disease.  History of preeclampsia or eclampsia.  Other medical conditions that change the level of hormones in the body (hormonal imbalance). What are the signs or symptoms? As with all types of hypertension, postpartum hypertension may not have any symptoms. Depending on how high your blood pressure is, you may experience:  Headaches. These may be mild, moderate, or severe. They may also be steady, constant, or sudden in onset (thunderclap headache).  Changes in your ability to see (visual changes).  Dizziness.  Shortness of  breath.  Swelling of your hands, feet, lower legs, or face. In some cases, you may have swelling in more than one of these locations.  Heart palpitations or a racing heartbeat.  Difficulty breathing while lying down.  Decrease in the amount of urine that you pass. Other rare signs and symptoms may include:  Sweating more than usual. This lasts longer than a few days after delivery.  Chest pain.  Sudden dizziness when you get up from sitting or lying down.  Seizures.  Nausea or vomiting.  Abdominal pain. How is this diagnosed? This condition may be diagnosed based on the results of a physical exam, blood pressure measurements, and blood and urine tests. You may also have other tests, such as a CT scan or an MRI, to check for other problems of postpartum hypertension. How is this treated? If blood pressure is high enough to require treatment, your options may include:  Medicines to reduce blood pressure (antihypertensives). Tell your health care provider if you are breastfeeding or if you plan to breastfeed. There are many antihypertensive medicines that are safe to take while breastfeeding.  Stopping medicines that may be causing hypertension.  Treating medical conditions that are causing hypertension.  Treating the complications of hypertension, such as seizures, stroke, or kidney problems. Your health care provider will also continue to monitor your blood pressure closely until it is within a safe range for you. Follow these instructions at home:  Take over-the-counter and prescription medicines only as told by your health care provider.  Return to your normal activities as told by your health care provider. Ask your health care provider what activities are safe for you.  Do not use any products that contain nicotine or tobacco, such as cigarettes and e-cigarettes.  If you need help quitting, ask your health care provider.  Keep all follow-up visits as told by your health  care provider. This is important. Contact a health care provider if:  Your symptoms get worse.  You have new symptoms, such as: ? A headache that does not get better. ? Dizziness. ? Visual changes. Get help right away if:  You suddenly develop swelling in your hands, ankles, or face.  You have sudden, rapid weight gain.  You develop difficulty breathing, chest pain, racing heartbeat, or heart palpitations.  You develop severe pain in your abdomen.  You have any symptoms of a stroke. "BE FAST" is an easy way to remember the main warning signs of a stroke: ? B - Balance. Signs are dizziness, sudden trouble walking, or loss of balance. ? E - Eyes. Signs are trouble seeing or a sudden change in vision. ? F - Face. Signs are sudden weakness or numbness of the face, or the face or eyelid drooping on one side. ? A - Arms. Signs are weakness or numbness in an arm. This happens suddenly and usually on one side of the body. ? S - Speech. Signs are sudden trouble speaking, slurred speech, or trouble understanding what people say. ? T - Time. Time to call emergency services. Write down what time symptoms started.  You have other signs of a stroke, such as: ? A sudden, severe headache with no known cause. ? Nausea or vomiting. ? Seizure. These symptoms may represent a serious problem that is an emergency. Do not wait to see if the symptoms will go away. Get medical help right away. Call your local emergency services (911 in the [place] Do not drive yourself to the hospital. Summary  Postpartum hypertension is high blood pressure that remains higher than normal after childbirth.  In most cases, postpartum hypertension will go away on its own, usually within a week of delivery.  For some women, medical treatment is required to prevent serious complications, such as seizures or stroke. This information is not intended to replace advice given to you by your health care provider. Make sure you  discuss any questions you have with your health care provider. Document Released: 01/02/2014 Document Revised: 02/19/2017 Document Reviewed: 02/19/2017 Elsevier Interactive Patient Education  2019 [employer]   HELLP Syndrome  HELLP syndrome is a severe complication of preeclampsia, a disorder of pregnancy that causes high blood pressure and other serious problems. In most cases, the syndrome develops before 35 weeks of pregnancy, but it can also develop right after childbirth. The condition is life-threatening to both the mother and baby. The letters in HELLP stand for these problems:  H - Hemolytic anemia, hemolysis. This refers to the destruction of blood cells.  EL - Elevated liver enzymes. This is a sign of liver damage.  LP - Low platelet count. This refers to blood cells that help stop bleeding. What are the causes? The cause of this condition is not known. What increases the risk? You are more likely to develop this condition if:  You are pregnant with more than one baby.  You have other medical conditions, such as diabetes or an autoimmune disease.  Any of these things happened during a previous pregnancy: ? You had preeclampsia or eclampsia. ? Your baby did not grow as expected. ? Your baby was born prematurely. ? Your placenta separated from your uterus (placental abruption). ? You lost your baby (fetal death). What are the signs or symptoms? Symptoms of this  condition include:  Severe headache.  Blurry or double vision.  Pain in the upper right abdomen.  Pain in the shoulder, neck, and upper body.  Fatigue.  Feeling sick.  Seizures.  Swelling of the hands, face, legs, and feet.  Sudden weight gain.  High blood pressure. How is this diagnosed? This condition may be diagnosed with blood and urine tests. These tests may include:  A complete blood count.  A liver function test.  A kidney function test.  A measurement of the salts and other  chemicals in your body (electrolytes).  A blood coagulation test.  Measurement of protein in the urine. You may also have other tests, including:  Checking your blood pressure.  Fetal ultrasound.  Monitoring your babys heart rate. How is this treated? This condition is treated by delivering the baby as soon as possible. You may be given medicines to start contractions (induce labor) so that the baby can be delivered vaginally, or you may have a cesarean delivery. Additional treatment may include:  Receiving an infusion of magnesium sulphate before delivery. Magnesium sulphate is a medicine that helps prevent seizures.  Receiving medicines to lower and control blood pressure. These may be given if your blood pressure is too high.  Receiving steroid hormones (corticosteroids) to help your baby's lungs mature faster. During your treatment, you and your baby will be monitored and your conditions will be managed. How is this prevented? If you are at risk for preeclampsia, your health care provider may recommend that you take one low-dose aspirin (a dose of 81 mg) each day during your pregnancy. This will help prevent high blood pressure. You may be at risk for preeclampsia if:  You had preeclampsia or eclampsia during a previous pregnancy.  Your baby did not grow as expected during a previous pregnancy.  One of your children was born prematurely.  You experienced a placental abruption during a previous pregnancy.  You lost your baby during a previous pregnancy.  You are pregnant with more than one baby.  You have a medical condition, such as diabetes or an autoimmune disease. Contact a health care provider if:  You gain more weight than expected.  You have headaches.  You have nausea or vomiting.  You have abdominal pain.  You feel dizzy or light-headed. Get help right away if:  You have a seizure.  You develop sudden or severe swelling anywhere in your body. This  usually happens in the legs.  You have changes in your ability to speak or move your body. Summary  HELLP syndrome is a severe complication of preeclampsia, a disorder of pregnancy that causes high blood pressure and other serious problems.  In most cases, the syndrome develops before 35 weeks of pregnancy, but it can also develop right after childbirth. The condition is life-threatening to both the mother and baby.  This condition is treated by delivering the baby as soon as possible. You may be given medicines to start contractions (induce labor) so that the baby can be delivered vaginally, or you may have a cesarean delivery.  During your treatment, you and your baby will be monitored and your conditions will be managed. This information is not intended to replace advice given to you by your health care provider. Make sure you discuss any questions you have with your health care provider. Document Released: 08/07/2006 Document Revised: 06/07/2016 Document Reviewed: 06/07/2016 Elsevier Interactive Patient Education  2019 [employer].

## 2018-07-12 ENCOUNTER — Other Ambulatory Visit: Payer: Self-pay

## 2018-07-12 ENCOUNTER — Emergency Department (HOSPITAL_COMMUNITY)
Admission: EM | Admit: 2018-07-12 | Discharge: 2018-07-12 | Disposition: A | Discharge status: Home / Self Care | Payer: BLUE CROSS/BLUE SHIELD | Attending: Emergency Medicine | Admitting: Emergency Medicine

## 2018-07-12 ENCOUNTER — Encounter (HOSPITAL_COMMUNITY): Payer: Self-pay | Admitting: *Deleted

## 2018-07-12 ENCOUNTER — Emergency Department (HOSPITAL_COMMUNITY): Payer: BLUE CROSS/BLUE SHIELD

## 2018-07-12 DIAGNOSIS — F419 Anxiety disorder, unspecified: Secondary | ICD-10-CM | POA: Diagnosis not present

## 2018-07-12 DIAGNOSIS — R51 Headache: Secondary | ICD-10-CM | POA: Insufficient documentation

## 2018-07-12 DIAGNOSIS — R519 Headache, unspecified: Secondary | ICD-10-CM

## 2018-07-12 DIAGNOSIS — F17210 Nicotine dependence, cigarettes, uncomplicated: Secondary | ICD-10-CM | POA: Diagnosis not present

## 2018-07-12 DIAGNOSIS — Z79899 Other long term (current) drug therapy: Secondary | ICD-10-CM | POA: Diagnosis not present

## 2018-07-12 LAB — URINALYSIS, ROUTINE W REFLEX MICROSCOPIC
Bacteria, UA: NONE SEEN
Bilirubin Urine: NEGATIVE
Glucose, UA: NEGATIVE mg/dL
Ketones, ur: NEGATIVE mg/dL
Leukocytes,Ua: NEGATIVE
Nitrite: NEGATIVE
Protein, ur: NEGATIVE mg/dL
Specific Gravity, Urine: 1.013 (ref 1.005–1.030)
pH: 6 (ref 5.0–8.0)

## 2018-07-12 LAB — CBC
HEMATOCRIT: 45.1 % (ref 36.0–46.0)
Hemoglobin: 15 g/dL (ref 12.0–15.0)
MCH: 30.7 pg (ref 26.0–34.0)
MCHC: 33.3 g/dL (ref 30.0–36.0)
MCV: 92.4 fL (ref 80.0–100.0)
Platelets: 335 10*3/uL (ref 150–400)
RBC: 4.88 MIL/uL (ref 3.87–5.11)
RDW: 15 % (ref 11.5–15.5)
WBC: 5.8 10*3/uL (ref 4.0–10.5)
nRBC: 0 % (ref 0.0–0.2)

## 2018-07-12 LAB — COMPREHENSIVE METABOLIC PANEL
ALT: 15 U/L (ref 0–44)
AST: 20 U/L (ref 15–41)
Albumin: 4.2 g/dL (ref 3.5–5.0)
Alkaline Phosphatase: 84 U/L (ref 38–126)
Anion gap: 8 (ref 5–15)
BUN: 13 mg/dL (ref 6–20)
CO2: 22 mmol/L (ref 22–32)
Calcium: 9.2 mg/dL (ref 8.9–10.3)
Chloride: 107 mmol/L (ref 98–111)
Creatinine, Ser: 0.45 mg/dL (ref 0.44–1.00)
GFR calc Af Amer: >60 mL/min (ref >60)
GFR calc non Af Amer: >60 mL/min (ref >60)
Glucose, Bld: 100 mg/dL — ABNORMAL HIGH (ref 70–99)
Potassium: 3.5 mmol/L (ref 3.5–5.1)
Sodium: 137 mmol/L (ref 135–145)
Total Bilirubin: 0.5 mg/dL (ref 0.3–1.2)
Total Protein: 7.6 g/dL (ref 6.5–8.1)

## 2018-07-12 LAB — CBG MONITORING, ED: Glucose-Capillary: 93 mg/dL (ref 70–99)

## 2018-07-12 MED ORDER — FENTANYL CITRATE (PF) 100 MCG/2ML IJ SOLN
50.0000 ug | Freq: Once | INTRAMUSCULAR | Status: AC
  Administered 2018-07-12: 50 ug via INTRAVENOUS
  Filled 2018-07-12: qty 2

## 2018-07-12 MED ORDER — SODIUM CHLORIDE 0.9 % IV BOLUS
1000.0000 mL | Freq: Once | INTRAVENOUS | Status: AC
  Administered 2018-07-12: 1000 mL via INTRAVENOUS
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
## 2018-07-12 NOTE — ED Notes (Signed)
Checked CBG 93, RN Kelly informed

## 2019-03-01 IMAGING — CT CT HEAD W/O CM
4 series · 17 of 47 positions shown, 19 images · non-contrast
Comparison: None.

CLINICAL DATA: Headache and vision changes tonight while driving.

EXAM:
CT HEAD WITHOUT CONTRAST
TECHNIQUE: Contiguous axial images were obtained from the base of the skull
through the vertex without intravenous contrast.

[Series 3: head wo · axial · 0.38mm/px · z∈[-149,-24]mm · 7 of 35 slices shown, 9 images]
[im 5/35  brain]
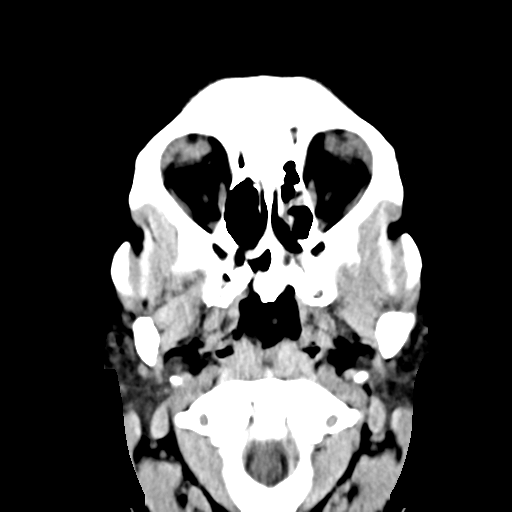
[im 5/35  bone]
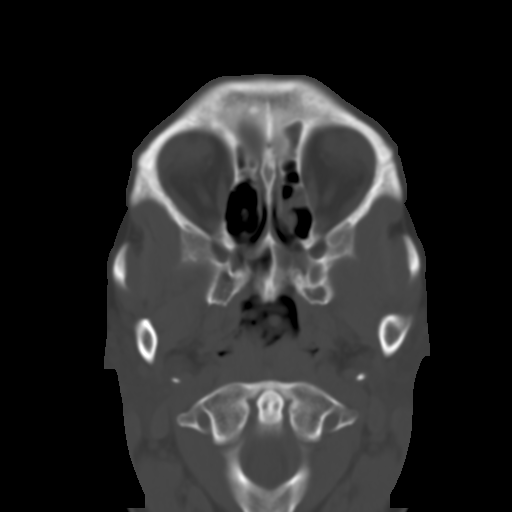
[im 9/35  brain]
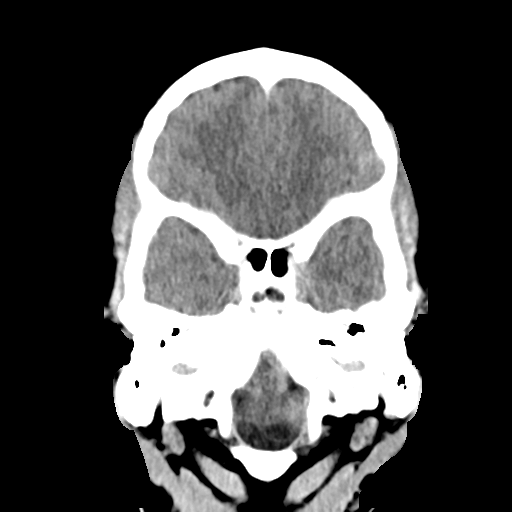
[im 13/35  brain]
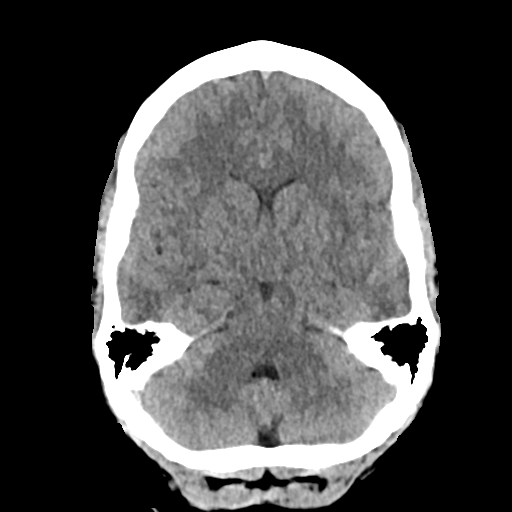
[im 18/35  brain]
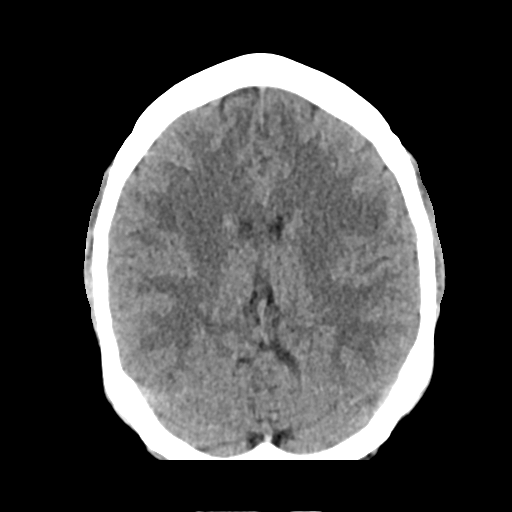
[im 22/35  brain]
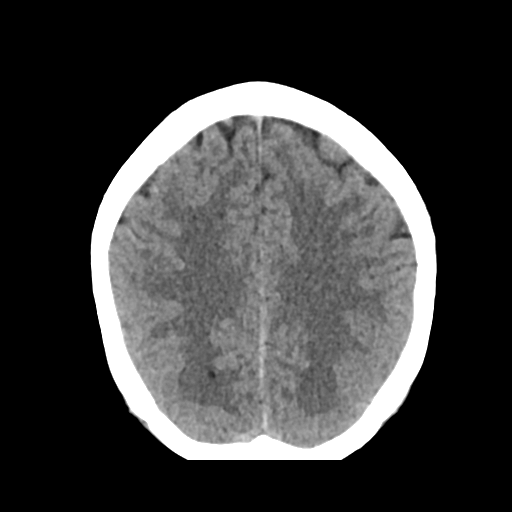
[im 22/35  bone]
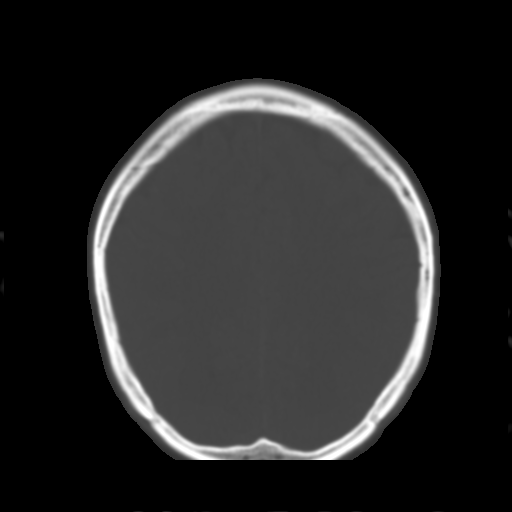
[im 26/35  brain]
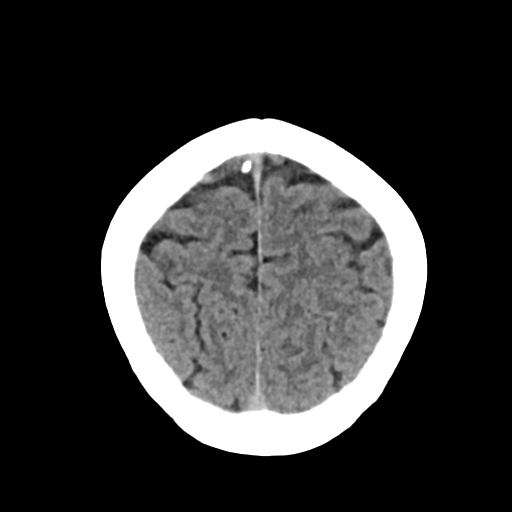
[im 30/35  brain]
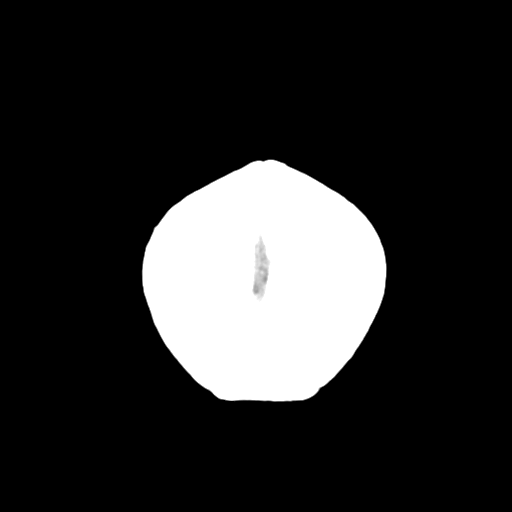

[Series 4: head bone · axial · 0.38mm/px · z∈[-153,-91]mm · 4 of 88 slices shown]
[im 9/88  bone]
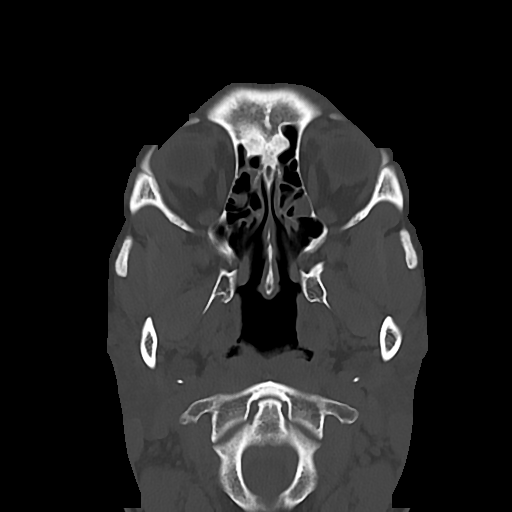
[im 18/88  bone]
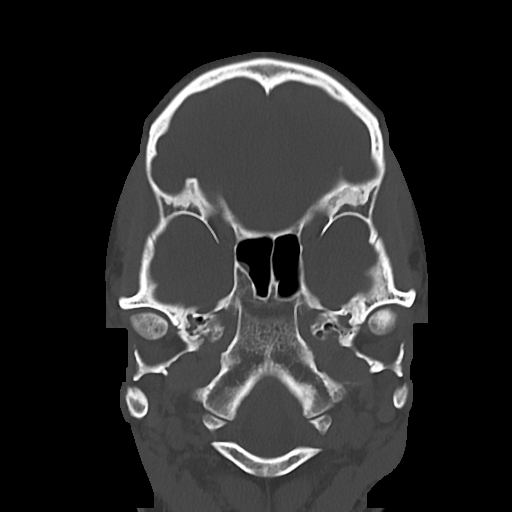
[im 27/88  bone]
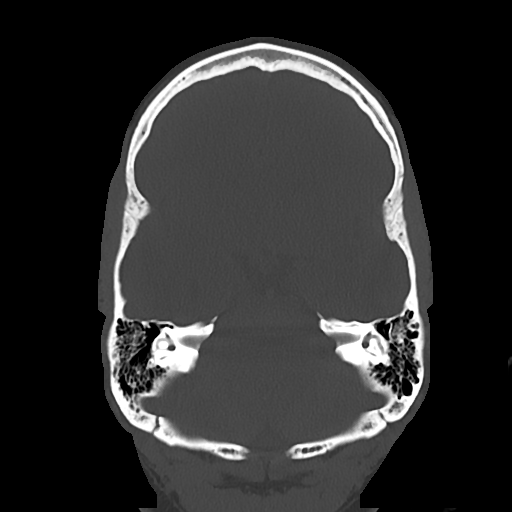
[im 40/88  bone]
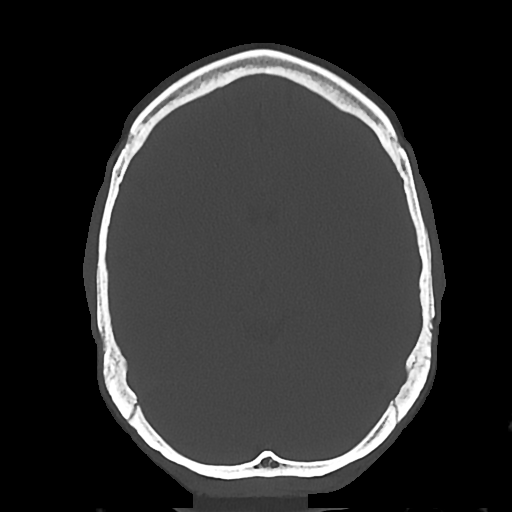

[Series 5: cor soft · coronal · 0.34mm/px · 3 of 67 slices shown]
[im 26/67  brain]
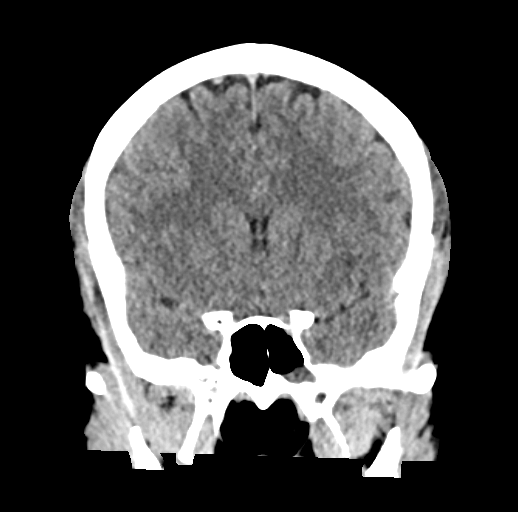
[im 31/67  brain]
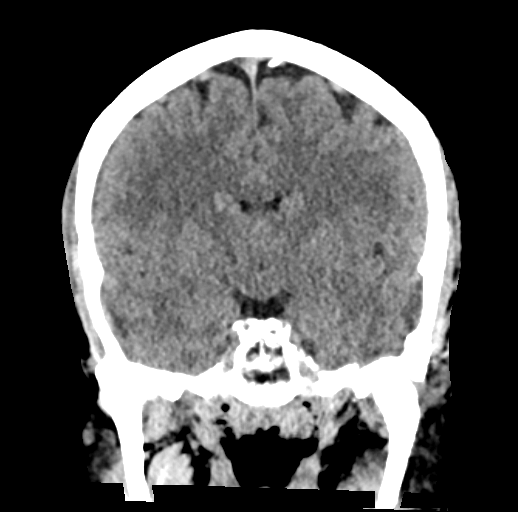
[im 36/67  brain]
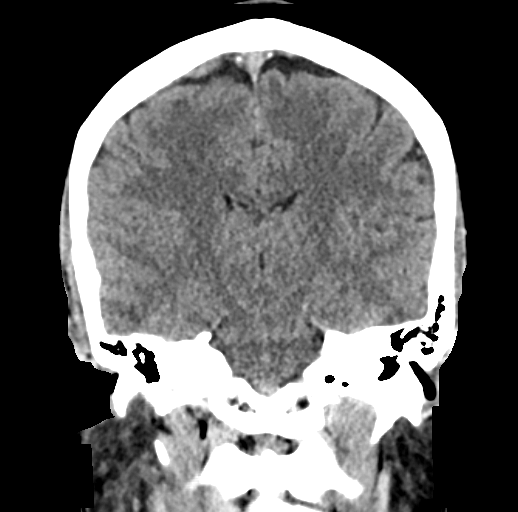

[Series 6: sag soft · sagittal · 0.38mm/px · 3 of 55 slices shown]
[im 19/55  brain]
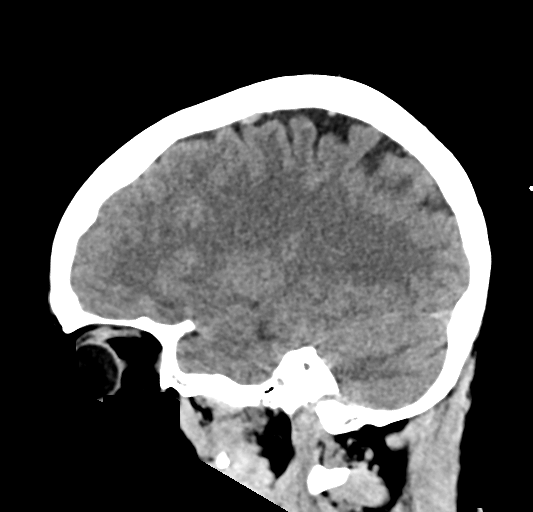
[im 28/55  brain]
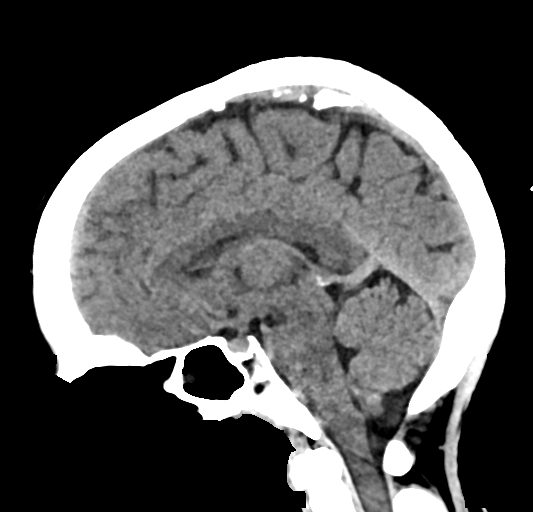
[im 37/55  brain]
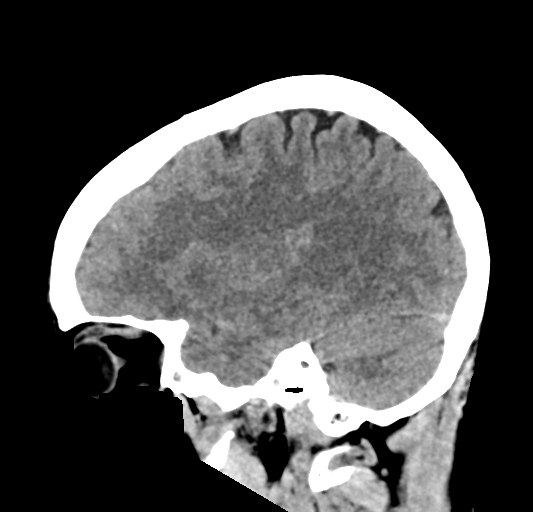

[17 of 47 positions shown; findings below may reference images not displayed]

FINDINGS: BRAIN: No intraparenchymal hemorrhage, mass effect nor midline
shift. The ventricles and sulci are normal. No acute large vascular
territory infarcts. No abnormal extra-axial fluid collections. Basal
cisterns are patent.

VASCULAR: Unremarkable.

SKULL/SOFT TISSUES: No skull fracture. No significant soft tissue
swelling.

ORBITS/SINUSES: The included ocular globes and orbital contents are
normal.Mild lobulated paranasal sinus mucosal thickening. Mastoid
air cells are well aerated. Soft tissue within external auditory
canal compatible with cerumen.

OTHER: None.
IMPRESSION: Normal CT HEAD without contrast.
